# Patient Record
Sex: Female | Born: 2000 | Race: Black or African American | Hispanic: No | Marital: Single | Smoking: Never smoker
Health system: Southern US, Community
[De-identification: ages and names within clinical notes are randomized; demographics above are authoritative.]

## PROBLEM LIST (undated history)

## (undated) ENCOUNTER — Inpatient Hospital Stay (HOSPITAL_COMMUNITY): Payer: Self-pay

## (undated) ENCOUNTER — Ambulatory Visit: Source: Home / Self Care

## (undated) DIAGNOSIS — Z789 Other specified health status: Secondary | ICD-10-CM

## (undated) DIAGNOSIS — D649 Anemia, unspecified: Secondary | ICD-10-CM

## (undated) HISTORY — DX: Anemia, unspecified: D64.9

## (undated) HISTORY — PX: NO PAST SURGERIES: SHX2092

## (undated) HISTORY — DX: Other specified health status: Z78.9

---
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
## 2022-11-20 ENCOUNTER — Other Ambulatory Visit: Payer: Self-pay

## 2022-11-20 ENCOUNTER — Emergency Department (HOSPITAL_COMMUNITY)
Admission: EM | Admit: 2022-11-20 | Discharge: 2022-11-21 | Discharge status: Against Medical Advice | Payer: Self-pay | Attending: Emergency Medicine | Admitting: Emergency Medicine

## 2022-11-20 ENCOUNTER — Encounter (HOSPITAL_COMMUNITY): Payer: Self-pay | Admitting: Emergency Medicine

## 2022-11-20 DIAGNOSIS — S39012A Strain of muscle, fascia and tendon of lower back, initial encounter: Secondary | ICD-10-CM | POA: Insufficient documentation

## 2022-11-20 DIAGNOSIS — Z5321 Procedure and treatment not carried out due to patient leaving prior to being seen by health care provider: Secondary | ICD-10-CM | POA: Insufficient documentation

## 2022-11-20 DIAGNOSIS — X58XXXA Exposure to other specified factors, initial encounter: Secondary | ICD-10-CM | POA: Insufficient documentation

## 2022-11-20 NOTE — ED Triage Notes (Signed)
Patient reports muscle strain at lower back with mild pain onset  yesterday when she sneezed multiple times .

## 2022-11-21 NOTE — ED Notes (Signed)
NA for vitals  °

## 2023-02-09 ENCOUNTER — Ambulatory Visit
Admission: EM | Admit: 2023-02-09 | Discharge: 2023-02-09 | Disposition: A | Discharge status: Home / Self Care | Payer: BC Managed Care – PPO | Attending: Family Medicine | Admitting: Family Medicine

## 2023-02-09 ENCOUNTER — Encounter: Payer: Self-pay | Admitting: Emergency Medicine

## 2023-02-09 DIAGNOSIS — N898 Other specified noninflammatory disorders of vagina: Secondary | ICD-10-CM | POA: Insufficient documentation

## 2023-02-09 DIAGNOSIS — M546 Pain in thoracic spine: Secondary | ICD-10-CM | POA: Diagnosis present

## 2023-02-09 DIAGNOSIS — J309 Allergic rhinitis, unspecified: Secondary | ICD-10-CM | POA: Insufficient documentation

## 2023-02-09 LAB — POCT URINALYSIS DIP (MANUAL ENTRY)
Bilirubin, UA: NEGATIVE
Glucose, UA: NEGATIVE mg/dL
Ketones, POC UA: NEGATIVE mg/dL
Leukocytes, UA: NEGATIVE
Nitrite, UA: NEGATIVE
Protein Ur, POC: NEGATIVE mg/dL
Spec Grav, UA: 1.025 (ref 1.010–1.025)
Urobilinogen, UA: 0.2 U/dL
pH, UA: 7 (ref 5.0–8.0)

## 2023-02-09 LAB — POCT URINE PREGNANCY: Preg Test, Ur: NEGATIVE

## 2023-02-09 MED ORDER — TIZANIDINE HCL 4 MG PO TABS: 4.0000 mg | ORAL_TABLET | Freq: Four times a day (QID) | ORAL | 0 refills | Status: DC | PRN

## 2023-02-09 NOTE — Discharge Instructions (Addendum)
You were seen today for various issues.  Your back pain is likely muscular in nature.  I have sent out a muscle relaxer for this.  I recommend motrin for pain, as well as heat/ice.  For your allergies I recommend you take over the counter allergy medications such as claritin/zyrtec, and add flonase nasal spray as well.  Your urine was normal.   Your vaginal swab will be resulted tomorrow and you will be notified if there is anything that needs to be treated.

## 2023-02-09 NOTE — ED Triage Notes (Signed)
States she's been having central lower back pain and mild abdominal cramping since Friday with some watery vaginal discharge. Does report unprotected intercourse with boyfriend, no use of birth control. States her period started this morning. Denies any fever, chills, dysuria, heavy lifting, new injury to back, numbness, tingling, weakness

## 2023-02-09 NOTE — ED Provider Notes (Signed)
[facility]    CSN: [id number] Arrival date & time: 02/09/23  1023      History   Chief Complaint Chief Complaint  Patient presents with   Back Pain    HPI LATOSIA GENNARELLI is a 22 y.o. female.    Back Pain Associated symptoms: abdominal pain   Patient is here for back pain.  This started over the weekend.  This she may have strained it from sneezing.  She has had allergies/congestion for  while.  Does not take anything for this.  She is having some abdominal pain, but thinks due to her period starting today.  She is sexually active.  She did not thin watery d/c and wanted to be tested for std.  No known exposures.  She did take pain reliever for abd cramping.        History reviewed. No pertinent past medical history.  There are no problems to display for this patient.   History reviewed. No pertinent surgical history.  OB History   No obstetric history on file.      Home Medications    Prior to Admission medications   Not on File    Family History History reviewed. No pertinent family history.  Social History Social History   Tobacco Use   Smoking status: Never   Smokeless tobacco: Never  Substance Use Topics   Alcohol use: Never   Drug use: Never     Allergies   Patient has no known allergies.   Review of Systems Review of Systems  Constitutional: Negative.   HENT:  Positive for congestion.   Respiratory: Negative.    Cardiovascular: Negative.   Gastrointestinal:  Positive for abdominal pain.  Genitourinary:  Positive for vaginal discharge.  Musculoskeletal:  Positive for back pain.     Physical Exam Triage Vital Signs ED Triage Vitals  Encounter Vitals Group     BP 02/09/23 1045 (!) 130/96     Systolic BP Percentile --      Diastolic BP Percentile --      Pulse Rate 02/09/23 1045 74     Resp 02/09/23 1045 16     Temp 02/09/23 1045 98.5 F (36.9 C)     Temp Source 02/09/23 1045 Oral     SpO2 02/09/23 1045 97 %      Weight --      Height --      Head Circumference --      Peak Flow --      Pain Score 02/09/23 1049 8     Pain Loc --      Pain Education --      Exclude from Growth Chart --    No data found.  Updated Vital Signs BP (!) 130/96   Pulse 74   Temp 98.5 F (36.9 C) (Oral)   Resp 16   SpO2 97%   Visual Acuity Right Eye Distance:   Left Eye Distance:   Bilateral Distance:    Right Eye Near:   Left Eye Near:    Bilateral Near:     Physical Exam Constitutional:      Appearance: Normal appearance.  HENT:     Nose: Congestion present.     Mouth/Throat:     Mouth: Mucous membranes are moist.  Cardiovascular:     Rate and Rhythm: Normal rate and regular rhythm.  Pulmonary:     Effort: Pulmonary effort is normal.     Breath sounds: Normal breath sounds.  Musculoskeletal:  Cervical back: Normal range of motion and neck supple.     Comments: +TTP to the mid back, at the spine and paraspinals;  very mildly tender;  no limited rom  Neurological:     General: No focal deficit present.     Mental Status: She is alert.  Psychiatric:        Mood and Affect: Mood normal.      UC Treatments / Results  Labs (all labs ordered are listed, but only abnormal results are displayed) Labs Reviewed  POCT URINALYSIS DIP (MANUAL ENTRY) - Abnormal; Notable for the following components:      Result Value   Blood, UA moderate (*)    All other components within normal limits  POCT URINE PREGNANCY  CERVICOVAGINAL ANCILLARY ONLY   Upt negative  EKG   Radiology No results found.  Procedures Procedures (including critical care time)  Medications Ordered in UC Medications - No data to display  Initial Impression / Assessment and Plan / UC Course  I have reviewed the triage vital signs and the nursing notes.  Pertinent labs & imaging results that were available during my care of the patient were reviewed by me and considered in my medical decision making (see chart for  details).   Final Clinical Impressions(s) / UC Diagnoses   Final diagnoses:  Acute midline thoracic back pain  Allergic rhinitis, unspecified seasonality, unspecified trigger  Vaginal discharge     Discharge Instructions      You were seen today for various issues.  Your back pain is likely muscular in nature.  I have sent out a muscle relaxer for this.  I recommend motrin for pain, as well as heat/ice.  For your allergies I recommend you take over the counter allergy medications such as claritin/zyrtec, and add flonase nasal spray as well.  Your urine was normal.   Your vaginal swab will be resulted tomorrow and you will be notified if there is anything that needs to be treated.     ED Prescriptions     Medication Sig Dispense Auth. Provider   tiZANidine (ZANAFLEX) 4 MG tablet Take 1 tablet (4 mg total) by mouth every 6 (six) hours as needed for muscle spasms. 20 tablet Jannifer Franklin, MD      PDMP not reviewed this encounter.   Jannifer Franklin, MD 02/09/23 1112

## 2023-02-11 LAB — CERVICOVAGINAL ANCILLARY ONLY
Bacterial Vaginitis (gardnerella): POSITIVE — AB
Candida Glabrata: NEGATIVE
Candida Vaginitis: NEGATIVE
Chlamydia: NEGATIVE
Comment: NEGATIVE
Comment: NEGATIVE
Comment: NEGATIVE
Comment: NEGATIVE
Comment: NEGATIVE
Comment: NORMAL
Neisseria Gonorrhea: NEGATIVE
Trichomonas: NEGATIVE

## 2023-02-12 ENCOUNTER — Telehealth: Payer: Self-pay | Admitting: Emergency Medicine

## 2023-02-12 ENCOUNTER — Telehealth: Payer: Self-pay | Admitting: Family Medicine

## 2023-02-12 MED ORDER — METRONIDAZOLE 500 MG PO TABS: 500.0000 mg | ORAL_TABLET | Freq: Two times a day (BID) | ORAL | 0 refills | Status: DC

## 2023-02-12 NOTE — Telephone Encounter (Signed)
Patient informed. 

## 2023-02-12 NOTE — Telephone Encounter (Signed)
Patient calling regarding her positive BV results on her Aptima swab from 02/09/2023.  Pharmacy on file is correct.  Please send prescription in.

## 2023-02-12 NOTE — Telephone Encounter (Signed)
+   BV.  Sent to pharmacy on file: Meds ordered this encounter  Medications   metroNIDAZOLE (FLAGYL) 500 MG tablet    Sig: Take 1 tablet (500 mg total) by mouth 2 (two) times daily.    Dispense:  14 tablet    Refill:  0

## 2023-04-22 NOTE — L&D Delivery Note (Signed)
 [facility] Delivery Note  Tracy Nichols is a 23 y.o. G1P0000 s/p SVD at 106w2d. She was admitted for IOL due to EFW at 16% with a BPP of 6/8.   ROM: 9h 89m with clear fluid GBS Status: Negative/-- (07/24 1555)     Labor Progress: Initial SVE: 1/thick/posterior. She then progressed to complete.   Delivery Date/Time: 11/28/2023 @ [phone] Delivery: Called to room and patient was complete and pushing. Head delivered ROA. Nuchal cord present x1 that was easily reduced.  Shoulder and body delivered in usual fashion. Infant with spontaneous cry, placed on mother's abdomen, dried and stimulated. Cord clamped x 2 after 1-minute delay, and cut by FOB. Cord blood drawn. Placenta delivered spontaneously with gentle cord traction. Fundus firm with massage and Pitocin . Labia, perineum, vagina, and cervix inspected inspected with 2nd degree perineal laceration found that was repaired with 3-0 vicryl. Left labial first degree also identified that was also repaired with 3-0 vicryl.   Baby Weight: pending  Placenta: Sent to L&D Complications: None Lacerations: 2nd degree perineal  1st degree left labial  EBL: 20 mL Analgesia: Epidural   Infant:  APGAR (1 MIN): 9  APGAR (5 MINS): 9

## 2023-06-24 ENCOUNTER — Ambulatory Visit: Payer: BC Managed Care – PPO | Admitting: *Deleted

## 2023-06-24 DIAGNOSIS — Z3A15 15 weeks gestation of pregnancy: Secondary | ICD-10-CM

## 2023-06-24 DIAGNOSIS — Z3402 Encounter for supervision of normal first pregnancy, second trimester: Secondary | ICD-10-CM

## 2023-06-24 DIAGNOSIS — Z3480 Encounter for supervision of other normal pregnancy, unspecified trimester: Secondary | ICD-10-CM | POA: Insufficient documentation

## 2023-06-24 NOTE — Progress Notes (Signed)
 New OB Intake  I connected with Tracy Nichols  on 06/24/23 at  2:10 PM EST by In Person Visit and verified that I am speaking with the correct person using two identifiers. Nurse is located at [facility] and pt is located at home.  I discussed the limitations, risks, security and privacy concerns of performing an evaluation and management service by telephone and the availability of in person appointments. I also discussed with the patient that there may be a patient responsible charge related to this service. The patient expressed understanding and agreed to proceed.  I explained I am completing New OB Intake today. We discussed EDD of 12/10/2023, by Last Menstrual Period. Pt is G1P0000. I reviewed her allergies, medications and Medical/Surgical/OB history.    There are no active problems to display for this patient.   Concerns addressed today  Delivery Plans Plans to deliver at [facility]. Discussed the nature of our practice with multiple providers including residents and students. Due to the size of the practice, the delivering provider may not be the same as those providing prenatal care.   Patient is not interested in water birth. Offered upcoming OB visit with CNM to discuss further.  MyChart/Babyscripts MyChart access verified. I explained pt will have some visits in office and some virtually. Babyscripts instructions given and order placed. Patient verifies receipt of registration text/e-mail. Account successfully created and app downloaded. If patient is a candidate for Optimized scheduling, add to sticky note.   Blood Pressure Cuff/Weight Scale Blood pressure cuff ordered for patient to pick-up from [employer]. Explained after first prenatal appt pt will check weekly and document in Babyscripts. Patient does not have weight scale; patient may purchase if they desire to track weight weekly in Babyscripts.  Anatomy US Explained first scheduled [place] will be around 19 weeks. Anatomy US  scheduled for TBD at TBD.  Interested in [place]? If yes, send referral and doula dot phrase.   Is patient a candidate for Babyscripts Optimization? Yes, patient accepted    First visit review I reviewed new OB appt with patient. Explained pt will be seen by Dr. Debroah Nichols at first visit. Discussed [name] genetic screening with patient. Requests Panorama and Horizon.. Routine prenatal labs  not collected. Virtual visit.    Last Pap No results found for: "DIAGPAP"  Harrel Lemon, RN 06/24/2023  2:42 PM

## 2023-06-24 NOTE — Patient Instructions (Signed)

## 2023-06-26 ENCOUNTER — Ambulatory Visit
Admission: EM | Admit: 2023-06-26 | Discharge: 2023-06-26 | Disposition: A | Discharge status: Home / Self Care | Attending: Internal Medicine | Admitting: Internal Medicine

## 2023-06-26 DIAGNOSIS — R21 Rash and other nonspecific skin eruption: Secondary | ICD-10-CM

## 2023-06-26 MED ORDER — HYDROCORTISONE 0.5 % EX CREA: 1.0000 | TOPICAL_CREAM | Freq: Two times a day (BID) | CUTANEOUS | 0 refills | Status: DC

## 2023-06-26 NOTE — Discharge Instructions (Signed)
 I have prescribed you topical cream to apply to face.  Do not get this in your eyes.  Follow-up with dermatology as I have placed a referral.

## 2023-06-26 NOTE — ED Triage Notes (Signed)
 Pt presents with rash that she first noticed in Nov. Pt initially thought it was ringworm. She has since learned it was not. The rash has spread since she first noticed. Rash areas are not painful, just dry and itchy. Now she is noticing the rash is spreading into her hairline. She has used Ringworm cream and it has helped a little but didn't remove the rash.

## 2023-06-26 NOTE — ED Provider Notes (Signed)
 [facility]    CSN: [id number] Arrival date & time: 06/26/23  1737      History   Chief Complaint Chief Complaint  Patient presents with   Rash    HPI Tracy Nichols is a 23 y.o. female.   Patient presents with itchy rash to face and neck that she noticed in November 2024.  Reports that she was seen once in urgent care.  Patient states she was not prescribed medication and she was not told what the rash was.  She originally thought it was ringworm so she has been applying topical antifungal cream which has not provided any improvement.  Denies any obvious known sick contacts.  Also reports she is currently [redacted] weeks pregnant.  Denies any obvious changes to the environment.  This patient states she is concerned it could be related to pregnancy as it started directly after she became pregnant.   Rash   Past Medical History:  Diagnosis Date   Medical history non-contributory     Patient Active Problem List   Diagnosis Date Noted   Supervision of other normal pregnancy, antepartum 06/24/2023    Past Surgical History:  Procedure Laterality Date   NO PAST SURGERIES      OB History     Gravida  1   Para  0   Term  0   Preterm  0   AB  0   Living  0      SAB  0   IAB  0   Ectopic  0   Multiple  0   Live Births  0            Home Medications    Prior to Admission medications   Medication Sig Start Date End Date Taking? Authorizing Provider  hydrocortisone cream 0.5 % Apply 1 Application topically 2 (two) times daily. Apply thin film 06/26/23  Yes Talita Recht, [place] E, FNP  metroNIDAZOLE (FLAGYL) 500 MG tablet Take 1 tablet (500 mg total) by mouth 2 (two) times daily. 02/12/23   Mardella Layman, MD  Prenatal Vit-Fe Fumarate-FA (PRENATAL VITAMIN PO) Take 1 tablet by mouth daily.    [provider]  tiZANidine (ZANAFLEX) 4 MG tablet Take 1 tablet (4 mg total) by mouth every 6 (six) hours as needed for muscle spasms. Patient not taking:  Reported on 06/24/2023 02/09/23   Jannifer Franklin, MD    Family History No family history on file.  Social History Social History   Tobacco Use   Smoking status: Never   Smokeless tobacco: Never  Vaping Use   Vaping status: Never Used  Substance Use Topics   Alcohol use: Never   Drug use: Not Currently    Types: Marijuana     Allergies   Patient has no known allergies.   Review of Systems Review of Systems Per HPI  Physical Exam Triage Vital Signs ED Triage Vitals  Encounter Vitals Group     BP 06/26/23 1816 137/84     Systolic BP Percentile --      Diastolic BP Percentile --      Pulse Rate 06/26/23 1816 96     Resp 06/26/23 1816 16     Temp 06/26/23 1816 97.7 F (36.5 C)     Temp Source 06/26/23 1816 Oral     SpO2 06/26/23 1816 98 %     Weight --      Height --      Head Circumference --  Peak Flow --      Pain Score 06/26/23 1815 0     Pain Loc --      Pain Education --      Exclude from Growth Chart --    No data found.  Updated Vital Signs BP 137/84 (BP Location: Right Arm)   Pulse 96   Temp 97.7 F (36.5 C) (Oral)   Resp 16   LMP 03/05/2023   SpO2 98%   Visual Acuity Right Eye Distance:   Left Eye Distance:   Bilateral Distance:    Right Eye Near:   Left Eye Near:    Bilateral Near:     Physical Exam Constitutional:      General: She is not in acute distress.    Appearance: Normal appearance. She is not toxic-appearing or diaphoretic.  HENT:     Head: Normocephalic and atraumatic.  Eyes:     Extraocular Movements: Extraocular movements intact.     Conjunctiva/sclera: Conjunctivae normal.  Pulmonary:     Effort: Pulmonary effort is normal.  Skin:    Comments: Patient has various circular shaped areas to face and 1 area to anterior neck that is flat, and mildly scaly.   Neurological:     General: No focal deficit present.     Mental Status: She is alert and oriented to person, place, and time. Mental status is at baseline.   Psychiatric:        Mood and Affect: Mood normal.        Behavior: Behavior normal.        Thought Content: Thought content normal.        Judgment: Judgment normal.      UC Treatments / Results  Labs (all labs ordered are listed, but only abnormal results are displayed) Labs Reviewed - No data to display  EKG   Radiology No results found.  Procedures Procedures (including critical care time)  Medications Ordered in UC Medications - No data to display  Initial Impression / Assessment and Plan / UC Course  I have reviewed the triage vital signs and the nursing notes.  Pertinent labs & imaging results that were available during my care of the patient were reviewed by me and considered in my medical decision making (see chart for details).     Rash is consistent with dermatitis.  No signs of fungal or bacterial infection.  Will treat with topical hydrocortisone at a very low dose.  I do think benefits outweigh risks while pregnant as it is also only topical.  Advised patient to not get this medication in her eyes and to apply a thin film.  Patient also advised to see dermatologist given how long rash has been present so ambulatory referral to dermatology was placed.  Advised strict return precautions.  Patient verbalized understanding and was agreeable with plan. Final Clinical Impressions(s) / UC Diagnoses   Final diagnoses:  Rash and nonspecific skin eruption     Discharge Instructions      I have prescribed you topical cream to apply to face.  Do not get this in your eyes.  Follow-up with dermatology as I have placed a referral.    ED Prescriptions     Medication Sig Dispense Auth. Provider   hydrocortisone cream 0.5 % Apply 1 Application topically 2 (two) times daily. Apply thin film 30 g Gustavus Bryant, [place]      PDMP not reviewed this encounter.   Gustavus Bryant, [place] 06/26/23 1850

## 2023-07-02 ENCOUNTER — Ambulatory Visit (INDEPENDENT_AMBULATORY_CARE_PROVIDER_SITE_OTHER): Payer: BC Managed Care – PPO | Admitting: Obstetrics & Gynecology

## 2023-07-02 VITALS — BP 116/79 | HR 93 | Ht 61.0 in | Wt 116.0 lb

## 2023-07-02 DIAGNOSIS — Z3480 Encounter for supervision of other normal pregnancy, unspecified trimester: Secondary | ICD-10-CM

## 2023-07-02 DIAGNOSIS — Z3402 Encounter for supervision of normal first pregnancy, second trimester: Secondary | ICD-10-CM | POA: Diagnosis not present

## 2023-07-02 DIAGNOSIS — Z3A17 17 weeks gestation of pregnancy: Secondary | ICD-10-CM

## 2023-07-02 NOTE — Progress Notes (Signed)
  Subjective:transferred from [employer] Tracy Nichols is a G1P0000 [redacted]w[redacted]d being seen today for her first obstetrical visit.  Her obstetrical history is significant for  healthy patient . Patient does intend to breast feed. Pregnancy history fully reviewed.  Patient reports nausea.  Vitals:   07/02/23 0825 07/02/23 0826  BP: 116/79   Pulse: 93   Weight: 116 lb (52.6 kg)   Height:  5\' 1"  (1.549 m)    HISTORY: OB History  Gravida Para Term Preterm AB Living  1 0 0 0 0 0  SAB IAB Ectopic Multiple Live Births  0 0 0 0 0    # Outcome Date GA Lbr Len/2nd Weight Sex Type Anes PTL Lv  1 Current            Past Medical History:  Diagnosis Date   Medical history non-contributory    Past Surgical History:  Procedure Laterality Date   NO PAST SURGERIES     No family history on file.   Exam    Uterus:   17 week  Pelvic Exam: Asked to defer   Perineum:    Vulva:    Vagina:     pH:    Cervix:    Adnexa:    Bony Pelvis:   System: Breast:     Skin: normal coloration and turgor, no rashes    Neurologic: oriented, normal mood   Extremities: normal strength, tone, and muscle mass   HEENT PERRLA, sclera clear, anicteric, and neck supple with midline trachea   Mouth/Teeth mucous membranes moist, pharynx normal without lesions and dental hygiene good   Neck supple   Cardiovascular: regular rate and rhythm, no murmurs or gallops   Respiratory:  appears well, vitals normal, no respiratory distress, acyanotic, normal RR, chest clear, no wheezing, crepitations, rhonchi, normal symmetric air entry   Abdomen: soft, non-tender; bowel sounds normal; no masses,  no organomegaly   Urinary:       Assessment:    Pregnancy: G1P0000 Patient Active Problem List   Diagnosis Date Noted   Supervision of other normal pregnancy, antepartum 06/24/2023        Plan:     Initial labs drawn. Prenatal vitamins. Problem list reviewed and updated. Genetic Screening discussed : ordered.   Ultrasound discussed; fetal survey: ordered.  Follow up in 4 weeks. 50% of 30 min visit spent on counseling and coordination of care.  Needs pap next visit   Scheryl Darter 07/02/2023

## 2023-07-03 LAB — CBC/D/PLT+RPR+RH+ABO+RUBIGG...
Antibody Screen: NEGATIVE
Basophils Absolute: 0.1 10*3/uL (ref 0.0–0.2)
Basos: 1 %
EOS (ABSOLUTE): 1.6 10*3/uL — ABNORMAL HIGH (ref 0.0–0.4)
Eos: 21 %
HCV Ab: NONREACTIVE
HIV Screen 4th Generation wRfx: NONREACTIVE
Hematocrit: 36.1 % (ref 34.0–46.6)
Hemoglobin: 11.9 g/dL (ref 11.1–15.9)
Hepatitis B Surface Ag: NEGATIVE
Immature Grans (Abs): 0 10*3/uL (ref 0.0–0.1)
Immature Granulocytes: 0 %
Lymphocytes Absolute: 2 10*3/uL (ref 0.7–3.1)
Lymphs: 26 %
MCH: 30.2 pg (ref 26.6–33.0)
MCHC: 33 g/dL (ref 31.5–35.7)
MCV: 92 fL (ref 79–97)
Monocytes Absolute: 0.6 10*3/uL (ref 0.1–0.9)
Monocytes: 7 %
Neutrophils Absolute: 3.5 10*3/uL (ref 1.4–7.0)
Neutrophils: 45 %
Platelets: 202 10*3/uL (ref 150–450)
RBC: 3.94 x10E6/uL (ref 3.77–5.28)
RDW: 12.7 % (ref 11.7–15.4)
RPR Ser Ql: NONREACTIVE
Rh Factor: POSITIVE
Rubella Antibodies, IGG: 1.07 {index} (ref >0.99)
WBC: 7.8 10*3/uL (ref 3.4–10.8)

## 2023-07-03 LAB — HCV INTERPRETATION

## 2023-07-04 LAB — CULTURE, OB URINE

## 2023-07-04 LAB — URINE CULTURE, OB REFLEX

## 2023-07-07 LAB — AFP, SERUM, OPEN SPINA BIFIDA
AFP MoM: 1.02
AFP Value: 50.9 ng/mL
Gest. Age on Collection Date: 17 wk
Maternal Age At EDD: 21.8 a
OSBR Risk 1 IN: 10000
Test Results:: NEGATIVE
Weight: 116 [lb_av]

## 2023-07-09 LAB — PANORAMA PRENATAL TEST FULL PANEL:PANORAMA TEST PLUS 5 ADDITIONAL MICRODELETIONS: FETAL FRACTION: 9

## 2023-07-13 LAB — HORIZON CUSTOM: REPORT SUMMARY: NEGATIVE

## 2023-07-14 ENCOUNTER — Encounter: Payer: Self-pay | Admitting: Obstetrics & Gynecology

## 2023-07-21 ENCOUNTER — Other Ambulatory Visit: Payer: Self-pay

## 2023-07-21 ENCOUNTER — Ambulatory Visit: Attending: Obstetrics and Gynecology

## 2023-07-21 ENCOUNTER — Ambulatory Visit

## 2023-07-21 VITALS — BP 115/77 | HR 91

## 2023-07-21 DIAGNOSIS — Z363 Encounter for antenatal screening for malformations: Secondary | ICD-10-CM | POA: Diagnosis present

## 2023-07-21 DIAGNOSIS — Z3480 Encounter for supervision of other normal pregnancy, unspecified trimester: Secondary | ICD-10-CM

## 2023-07-21 DIAGNOSIS — O365990 Maternal care for other known or suspected poor fetal growth, unspecified trimester, not applicable or unspecified: Secondary | ICD-10-CM | POA: Diagnosis not present

## 2023-07-21 DIAGNOSIS — Z3689 Encounter for other specified antenatal screening: Secondary | ICD-10-CM | POA: Insufficient documentation

## 2023-07-21 DIAGNOSIS — O365920 Maternal care for other known or suspected poor fetal growth, second trimester, not applicable or unspecified: Secondary | ICD-10-CM

## 2023-07-21 DIAGNOSIS — Z3A19 19 weeks gestation of pregnancy: Secondary | ICD-10-CM | POA: Diagnosis not present

## 2023-07-30 ENCOUNTER — Ambulatory Visit (INDEPENDENT_AMBULATORY_CARE_PROVIDER_SITE_OTHER): Admitting: Obstetrics and Gynecology

## 2023-07-30 ENCOUNTER — Encounter: Payer: Self-pay | Admitting: Obstetrics and Gynecology

## 2023-07-30 VITALS — BP 99/76 | HR 94 | Wt 124.0 lb

## 2023-07-30 DIAGNOSIS — O26892 Other specified pregnancy related conditions, second trimester: Secondary | ICD-10-CM

## 2023-07-30 DIAGNOSIS — Z3480 Encounter for supervision of other normal pregnancy, unspecified trimester: Secondary | ICD-10-CM

## 2023-07-30 DIAGNOSIS — Z5320 Procedure and treatment not carried out because of patient's decision for unspecified reasons: Secondary | ICD-10-CM | POA: Diagnosis not present

## 2023-07-30 DIAGNOSIS — Z3A21 21 weeks gestation of pregnancy: Secondary | ICD-10-CM | POA: Diagnosis not present

## 2023-07-30 DIAGNOSIS — R519 Headache, unspecified: Secondary | ICD-10-CM

## 2023-07-30 NOTE — Progress Notes (Addendum)
 Pt complains of HA's, has not tried OTC medication. Pt also craving Ice.   Pt request to have pap at [facility] visit.

## 2023-07-30 NOTE — Progress Notes (Signed)
   PRENATAL VISIT NOTE  Subjective:  Tracy Nichols is a 23 y.o. G1P0000 at [redacted]w[redacted]d being seen today for ongoing prenatal care.  She is currently monitored for the following issues for this low-risk pregnancy and has Supervision of other normal pregnancy, antepartum on their problem list.  Patient reports headache. Has not tried OTC meds. Reports typically goes away when she drinks water. Had caffeine this morning, no improvement. Denies visual changes or severe pain.  Contractions: Not present. Vag. Bleeding: None.  Movement: Present. Denies leaking of fluid.   The following portions of the patient's history were reviewed and updated as appropriate: allergies, current medications, past family history, past medical history, past social history, past surgical history and problem list.   Objective:   Vitals:   07/30/23 0901  BP: 99/76  Pulse: 94  Weight: 124 lb (56.2 kg)   Body mass index is 23.43 kg/m. Total weight gain: 19 lb (8.618 kg)   Fetal Status: Fetal Heart Rate (bpm): 140 Fundal Height: 21 cm Movement: Present     General:  Alert, oriented and cooperative. Patient is in no acute distress.  Skin: Skin is warm and dry. No rash noted.   Cardiovascular: Normal heart rate noted  Respiratory: Normal respiratory effort, no problems with respiration noted  Abdomen: Soft, gravid, appropriate for gestational age.  Pain/Pressure: Absent     Pelvic: Cervical exam deferred        Extremities: Normal range of motion.     Mental Status: Normal mood and affect. Normal behavior. Normal judgment and thought content.   Assessment and Plan:  Pregnancy: G1P0000 at [redacted]w[redacted]d 1. Supervision of other normal pregnancy, antepartum (Primary) Routine care S/p anatomy ultrasound on 07/21/23 Has f/u ultrasound scheduled for 5/13 Prenatal labs and genetics within normal limits   2. [redacted] weeks gestation of pregnancy See above  3. Pap smear of cervix declined Recommend pap, she declines to postpartum  4.  Headache in pregnancy: recommend trial of tylenol, if not improved or frequent headaches recommend magnesium supplementation   Preterm labor symptoms and general obstetric precautions including but not limited to vaginal bleeding, contractions, leaking of fluid and fetal movement were reviewed in detail with the patient. Please refer to After Visit Summary for other counseling recommendations.   Return in about 4 weeks (around 08/27/2023).  Future Appointments  Date Time Provider Department Center  09/01/2023  3:00 PM [facility] PROVIDER 1 [facility]  09/01/2023  3:30 PM [facility] US2 [facility]    Wanita Chamberlain, MD

## 2023-08-13 ENCOUNTER — Ambulatory Visit
Admission: EM | Admit: 2023-08-13 | Discharge: 2023-08-13 | Disposition: A | Discharge status: Home / Self Care | Attending: Family Medicine | Admitting: Family Medicine

## 2023-08-13 ENCOUNTER — Encounter: Payer: Self-pay | Admitting: Emergency Medicine

## 2023-08-13 DIAGNOSIS — N760 Acute vaginitis: Secondary | ICD-10-CM | POA: Diagnosis present

## 2023-08-13 DIAGNOSIS — Z3A23 23 weeks gestation of pregnancy: Secondary | ICD-10-CM | POA: Insufficient documentation

## 2023-08-13 MED ORDER — CLOTRIMAZOLE 1 % VA CREA: 1.0000 | TOPICAL_CREAM | Freq: Every day | VAGINAL | 0 refills | Status: AC

## 2023-08-13 MED ORDER — CLOTRIMAZOLE 1 % VA CREA: 1.0000 | TOPICAL_CREAM | Freq: Every day | VAGINAL | 0 refills | Status: DC

## 2023-08-13 NOTE — Discharge Instructions (Signed)
 Lab results take up to 2-4 days, however can result sooner.   Our office will only call if results are abnormal and require treatment. If you see a result and have questions feel free to reach out via phone and call this office directly and request to speak with the nurse.  All results will be available to view on MyChart.

## 2023-08-13 NOTE — ED Triage Notes (Signed)
 Pt reports vaginal itching and white discharge x1week. Believes she has a yeast infection and noted last time she was treated for yeast she developed BV. Pt is currently 6 months pregnant.

## 2023-08-13 NOTE — ED Provider Notes (Signed)
 [facility]    CSN: [id number] Arrival date & time: 08/13/23  1545      History   Chief Complaint Chief Complaint  Patient presents with   Vaginal Discharge   Vaginal Itching    HPI Tracy Nichols is a 23 y.o. female.   Here with abnormal vaginal discharge x 1 week.  Patient is concerned she may have a yeast infection.  Patient tested positive for BV several months ago thinks she may have some residual yeast related to that treatment back in October.  Patient is currently [redacted] weeks pregnant and is followed by OB and recently had a visit a week ago.  Past Medical History:  Diagnosis Date   Medical history non-contributory     Patient Active Problem List   Diagnosis Date Noted   Supervision of other normal pregnancy, antepartum 06/24/2023    Past Surgical History:  Procedure Laterality Date   NO PAST SURGERIES      OB History     Gravida  1   Para  0   Term  0   Preterm  0   AB  0   Living  0      SAB  0   IAB  0   Ectopic  0   Multiple  0   Live Births  0            Home Medications    Prior to Admission medications   Medication Sig Start Date End Date Taking? Authorizing Provider  hydrocortisone  cream 0.5 % Apply 1 Application topically 2 (two) times daily. Apply thin film 06/26/23  [place], [place] E, FNP  clotrimazole  (GYNE-LOTRIMIN ) 1 % vaginal cream Place 1 Applicatorful vaginally at bedtime for 5 days. 08/13/23 08/18/23  Buena Carmine, NP  Prenatal Vit-Fe Fumarate-FA (PRENATAL VITAMIN PO) Take 1 tablet by mouth daily. Patient not taking: Reported on 07/21/2023    [provider]  tiZANidine  (ZANAFLEX ) 4 MG tablet Take 1 tablet (4 mg total) by mouth every 6 (six) hours as needed for muscle spasms. Patient not taking: Reported on 06/24/2023 02/09/23   Lesle Ras, MD    Family History History reviewed. No pertinent family history.  Social History Social History   Tobacco Use   Smoking status: Never    Smokeless tobacco: Never  Vaping Use   Vaping status: Never Used  Substance Use Topics   Alcohol use: Not Currently   Drug use: Not Currently    Types: Marijuana     Allergies   Patient has no known allergies.   Review of Systems Review of Systems  Genitourinary:  Positive for vaginal discharge.     Physical Exam Triage Vital Signs ED Triage Vitals [08/13/23 1652]  Encounter Vitals Group     BP 121/83     Systolic BP Percentile      Diastolic BP Percentile      Pulse Rate 67     Resp 14     Temp 97.8 F (36.6 C)     Temp Source Oral     SpO2 99 %     Weight      Height      Head Circumference      Peak Flow      Pain Score 0     Pain Loc      Pain Education      Exclude from Growth Chart    No data found.  Updated Vital Signs BP 121/83 (BP  Location: Left Arm)   Pulse 67   Temp 97.8 F (36.6 C) (Oral)   Resp 14   LMP 03/05/2023   SpO2 99%   Visual Acuity Right Eye Distance:   Left Eye Distance:   Bilateral Distance:    Right Eye Near:   Left Eye Near:    Bilateral Near:     Physical Exam Vitals reviewed.  Constitutional:      Appearance: Normal appearance.  HENT:     Head: Normocephalic and atraumatic.  Eyes:     Extraocular Movements: Extraocular movements intact.     Pupils: Pupils are equal, round, and reactive to light.  Cardiovascular:     Rate and Rhythm: Normal rate and regular rhythm.  Pulmonary:     Effort: Pulmonary effort is normal.     Breath sounds: Normal breath sounds.  Skin:    General: Skin is warm.  Neurological:     General: No focal deficit present.     Mental Status: She is alert and oriented to person, place, and time.     Cytology self collected UC Treatments / Results  Labs (all labs ordered are listed, but only abnormal results are displayed) Labs Reviewed  CERVICOVAGINAL ANCILLARY ONLY    EKG   Radiology No results found.  Procedures Procedures (including critical care time)  Medications  Ordered in UC Medications - No data to display  Initial Impression / Assessment and Plan / UC Course  I have reviewed the triage vital signs and the nursing notes.  Pertinent labs & imaging results that were available during my care of the patient were reviewed by me and considered in my medical decision making (see chart for details).    Vaginitis in patient that is [redacted] weeks pregnant, treatment with Clotrimazole . Cytology testing for BV, candidiasis, GC chlamydia, gonorrhea, and trichomonas. Patient aware that our office will reach out to her if any additional treatment is needed after review of lab results. Final Clinical Impressions(s) / UC Diagnoses   Final diagnoses:  Vaginitis and vulvovaginitis  [redacted] weeks gestation of pregnancy     Discharge Instructions      Lab results take up to 2-4 days, however can result sooner.   Our office will only call if results are abnormal and require treatment. If you see a result and have questions feel free to reach out via phone and call this office directly and request to speak with the nurse.  All results will be available to view on MyChart.      ED Prescriptions     Medication Sig Dispense Auth. Provider   clotrimazole  (GYNE-LOTRIMIN ) 1 % vaginal cream  (Status: Discontinued) Place 1 Applicatorful vaginally at bedtime. 45 g Buena Carmine, NP   clotrimazole  (GYNE-LOTRIMIN ) 1 % vaginal cream Place 1 Applicatorful vaginally at bedtime for 5 days. 90 g Buena Carmine, NP      PDMP not reviewed this encounter.   Buena Carmine, NP 08/14/23 [phone]

## 2023-08-14 LAB — CERVICOVAGINAL ANCILLARY ONLY
Bacterial Vaginitis (gardnerella): NEGATIVE
Candida Glabrata: NEGATIVE
Candida Vaginitis: NEGATIVE
Chlamydia: NEGATIVE
Comment: NEGATIVE
Comment: NEGATIVE
Comment: NEGATIVE
Comment: NEGATIVE
Comment: NEGATIVE
Comment: NORMAL
Neisseria Gonorrhea: NEGATIVE
Trichomonas: NEGATIVE

## 2023-08-27 ENCOUNTER — Ambulatory Visit: Admitting: Obstetrics & Gynecology

## 2023-08-27 ENCOUNTER — Encounter: Admitting: Obstetrics & Gynecology

## 2023-08-27 VITALS — BP 123/85 | HR 91 | Wt 131.4 lb

## 2023-08-27 DIAGNOSIS — Z3480 Encounter for supervision of other normal pregnancy, unspecified trimester: Secondary | ICD-10-CM | POA: Diagnosis not present

## 2023-08-27 NOTE — Progress Notes (Signed)
 Pt presents for rob pt has no questions or concerns at this time.

## 2023-08-27 NOTE — Progress Notes (Signed)
   PRENATAL VISIT NOTE  Subjective:  Tracy Nichols is a 23 y.o. G1P0000 at [redacted]w[redacted]d being seen today for ongoing prenatal care.  She is currently monitored for the following issues for this low-risk pregnancy and has Supervision of other normal pregnancy, antepartum on their problem list.  Patient reports round ligament pain.  Contractions: Not present. Vag. Bleeding: None.  Movement: Present. Denies leaking of fluid.   The following portions of the patient's history were reviewed and updated as appropriate: allergies, current medications, past family history, past medical history, past social history, past surgical history and problem list.   Objective:   Vitals:   08/27/23 1548  BP: 123/85  Pulse: 91  Weight: 131 lb 6.4 oz (59.6 kg)     Fetal Status: Fetal Heart Rate (bpm): 155   Movement: Present      General: Alert, oriented and cooperative. Patient is in no acute distress.  Skin: Skin is warm and dry. No rash noted.   Cardiovascular: Normal heart rate noted  Respiratory: Normal respiratory effort, no problems with respiration noted  Abdomen: Soft, gravid, appropriate for gestational age.  Pain/Pressure: Present     Pelvic: Cervical exam deferred        Extremities: Normal range of motion.  Edema: None  Mental Status: Normal mood and affect. Normal behavior. Normal judgment and thought content.   Assessment and Plan:  Pregnancy: G1P0000 at [redacted]w[redacted]d 1. Supervision of other normal pregnancy, antepartum (Primary) 2 hr GTT next  Preterm labor symptoms and general obstetric precautions including but not limited to vaginal bleeding, contractions, leaking of fluid and fetal movement were reviewed in detail with the patient. Please refer to After Visit Summary for other counseling recommendations.   Return in about 3 weeks (around 09/17/2023).  No future appointments.  Onnie Bilis, MD

## 2023-09-01 ENCOUNTER — Ambulatory Visit

## 2023-09-01 ENCOUNTER — Telehealth: Payer: Self-pay

## 2023-09-21 ENCOUNTER — Other Ambulatory Visit (HOSPITAL_COMMUNITY)
Admission: RE | Admit: 2023-09-21 | Discharge: 2023-09-21 | Disposition: A | Discharge status: Home / Self Care | Source: Ambulatory Visit | Attending: Obstetrics and Gynecology | Admitting: Obstetrics and Gynecology

## 2023-09-21 ENCOUNTER — Other Ambulatory Visit

## 2023-09-21 ENCOUNTER — Ambulatory Visit: Payer: Self-pay | Admitting: Obstetrics and Gynecology

## 2023-09-21 ENCOUNTER — Encounter: Payer: Self-pay | Admitting: Obstetrics and Gynecology

## 2023-09-21 VITALS — BP 107/73 | HR 97 | Wt 133.0 lb

## 2023-09-21 DIAGNOSIS — Z3A28 28 weeks gestation of pregnancy: Secondary | ICD-10-CM

## 2023-09-21 DIAGNOSIS — O26899 Other specified pregnancy related conditions, unspecified trimester: Secondary | ICD-10-CM | POA: Insufficient documentation

## 2023-09-21 DIAGNOSIS — Z1331 Encounter for screening for depression: Secondary | ICD-10-CM

## 2023-09-21 DIAGNOSIS — Z3480 Encounter for supervision of other normal pregnancy, unspecified trimester: Secondary | ICD-10-CM | POA: Diagnosis present

## 2023-09-21 DIAGNOSIS — N898 Other specified noninflammatory disorders of vagina: Secondary | ICD-10-CM | POA: Diagnosis not present

## 2023-09-21 DIAGNOSIS — O26893 Other specified pregnancy related conditions, third trimester: Secondary | ICD-10-CM | POA: Diagnosis not present

## 2023-09-21 NOTE — Progress Notes (Addendum)
 ROB/GTT. Declined TDAp. C/o vaginal discharge, irritation, self swab done.

## 2023-09-21 NOTE — Progress Notes (Signed)
   PRENATAL VISIT NOTE  Subjective:  Tracy Nichols is a 23 y.o. G1P0000 at [redacted]w[redacted]d being seen today for ongoing prenatal care.  She is currently monitored for the following issues for this low-risk pregnancy and has Supervision of other normal pregnancy, antepartum on their problem list.  Patient reports vaginal irritation.  Contractions: Not present. Vag. Bleeding: None.  Movement: Present. Denies leaking of fluid.   The following portions of the patient's history were reviewed and updated as appropriate: allergies, current medications, past family history, past medical history, past social history, past surgical history and problem list.   Objective:    Vitals:   09/21/23 1001  BP: 107/73  Pulse: 97  Weight: 133 lb (60.3 kg)    Fetal Status:  Fetal Heart Rate (bpm): 151 Fundal Height: 28 cm Movement: Present    General: Alert, oriented and cooperative. Patient is in no acute distress.  Skin: Skin is warm and dry. No rash noted.   Cardiovascular: Normal heart rate noted  Respiratory: Normal respiratory effort, no problems with respiration noted  Abdomen: Soft, gravid, appropriate for gestational age.  Pain/Pressure: Present     Pelvic: Cervical exam deferred        Extremities: Normal range of motion.  Edema: None  Mental Status: Normal mood and affect. Normal behavior. Normal judgment and thought content.   Assessment and Plan:  Pregnancy: G1P0000 at [redacted]w[redacted]d 1. Supervision of other normal pregnancy, antepartum (Primary) Patient is doing well without complaints Undecided on pediatrician Third trimester labs and glucola today - Glucose Tolerance, 2 Hours w/1 Hour - RPR - CBC - HIV antibody (with reflex) - Ambulatory referral to Integrated Behavioral Health - Cervicovaginal ancillary only( [place])  2. [redacted] weeks gestation of pregnancy  - Glucose Tolerance, 2 Hours w/1 Hour - RPR - CBC - HIV antibody (with reflex) - Ambulatory referral to Integrated Behavioral Health -  Cervicovaginal ancillary only( [place])  3. Vaginal discharge during pregnancy, antepartum Self swab collected for vaginal irritation - Cervicovaginal ancillary only( [place])  Preterm labor symptoms and general obstetric precautions including but not limited to vaginal bleeding, contractions, leaking of fluid and fetal movement were reviewed in detail with the patient. Please refer to After Visit Summary for other counseling recommendations.   Return in about 2 weeks (around 10/05/2023) for in person, ROB, Low risk.  Future Appointments  Date Time Provider Department Center  09/28/2023  7:00 AM [facility] PROVIDER 1 [facility]  09/28/2023  7:30 AM [facility] US3 [facility]    Verlyn Goad, MD

## 2023-09-21 NOTE — Patient Instructions (Signed)

## 2023-09-22 ENCOUNTER — Ambulatory Visit: Payer: Self-pay | Admitting: Obstetrics and Gynecology

## 2023-09-22 ENCOUNTER — Other Ambulatory Visit: Payer: Self-pay

## 2023-09-22 ENCOUNTER — Encounter: Payer: Self-pay | Admitting: Obstetrics and Gynecology

## 2023-09-22 DIAGNOSIS — O99013 Anemia complicating pregnancy, third trimester: Secondary | ICD-10-CM | POA: Insufficient documentation

## 2023-09-22 DIAGNOSIS — B379 Candidiasis, unspecified: Secondary | ICD-10-CM

## 2023-09-22 DIAGNOSIS — Z3480 Encounter for supervision of other normal pregnancy, unspecified trimester: Secondary | ICD-10-CM

## 2023-09-22 LAB — CBC
Hematocrit: 31.1 % — ABNORMAL LOW (ref 34.0–46.6)
Hemoglobin: 9.9 g/dL — ABNORMAL LOW (ref 11.1–15.9)
MCH: 29.1 pg (ref 26.6–33.0)
MCHC: 31.8 g/dL (ref 31.5–35.7)
MCV: 92 fL (ref 79–97)
Platelets: 164 10*3/uL (ref 150–450)
RBC: 3.4 x10E6/uL — ABNORMAL LOW (ref 3.77–5.28)
RDW: 12.3 % (ref 11.7–15.4)
WBC: 7.3 10*3/uL (ref 3.4–10.8)

## 2023-09-22 LAB — CERVICOVAGINAL ANCILLARY ONLY
Bacterial Vaginitis (gardnerella): NEGATIVE
Candida Glabrata: NEGATIVE
Candida Vaginitis: NEGATIVE
Chlamydia: NEGATIVE
Comment: NEGATIVE
Comment: NEGATIVE
Comment: NEGATIVE
Comment: NEGATIVE
Comment: NEGATIVE
Comment: NORMAL
Neisseria Gonorrhea: NEGATIVE
Trichomonas: NEGATIVE

## 2023-09-22 LAB — HIV ANTIBODY (ROUTINE TESTING W REFLEX): HIV Screen 4th Generation wRfx: NONREACTIVE

## 2023-09-22 LAB — RPR: RPR Ser Ql: NONREACTIVE

## 2023-09-22 LAB — GLUCOSE TOLERANCE, 2 HOURS W/ 1HR
Glucose, 1 hour: 135 mg/dL (ref 70–179)
Glucose, 2 hour: 73 mg/dL (ref 70–152)
Glucose, Fasting: 66 mg/dL — ABNORMAL LOW (ref 70–91)

## 2023-09-22 MED ORDER — TERCONAZOLE 0.8 % VA CREA: 1.0000 | TOPICAL_CREAM | Freq: Every day | VAGINAL | 0 refills | Status: DC

## 2023-09-22 MED ORDER — ACCRUFER 30 MG PO CAPS: 1.0000 | ORAL_CAPSULE | Freq: Every day | ORAL | 3 refills | Status: DC

## 2023-09-28 ENCOUNTER — Other Ambulatory Visit: Payer: Self-pay

## 2023-09-28 ENCOUNTER — Other Ambulatory Visit: Payer: Self-pay | Admitting: *Deleted

## 2023-09-28 ENCOUNTER — Ambulatory Visit

## 2023-09-28 ENCOUNTER — Ambulatory Visit: Attending: Obstetrics and Gynecology | Admitting: Obstetrics and Gynecology

## 2023-09-28 VITALS — BP 121/78

## 2023-09-28 DIAGNOSIS — O99013 Anemia complicating pregnancy, third trimester: Secondary | ICD-10-CM | POA: Diagnosis not present

## 2023-09-28 DIAGNOSIS — O365920 Maternal care for other known or suspected poor fetal growth, second trimester, not applicable or unspecified: Secondary | ICD-10-CM

## 2023-09-28 DIAGNOSIS — D649 Anemia, unspecified: Secondary | ICD-10-CM

## 2023-09-28 DIAGNOSIS — Z3A29 29 weeks gestation of pregnancy: Secondary | ICD-10-CM | POA: Diagnosis not present

## 2023-09-28 DIAGNOSIS — O365930 Maternal care for other known or suspected poor fetal growth, third trimester, not applicable or unspecified: Secondary | ICD-10-CM | POA: Diagnosis not present

## 2023-09-28 DIAGNOSIS — Z362 Encounter for other antenatal screening follow-up: Secondary | ICD-10-CM

## 2023-09-28 DIAGNOSIS — Z363 Encounter for antenatal screening for malformations: Secondary | ICD-10-CM | POA: Insufficient documentation

## 2023-09-28 DIAGNOSIS — Z3480 Encounter for supervision of other normal pregnancy, unspecified trimester: Secondary | ICD-10-CM

## 2023-09-28 MED ORDER — ACCRUFER 30 MG PO CAPS: 1.0000 | ORAL_CAPSULE | Freq: Every day | ORAL | 3 refills | Status: DC

## 2023-09-28 MED ORDER — ACCRUFER 30 MG PO CAPS: 1.0000 | ORAL_CAPSULE | Freq: Every day | ORAL | 3 refills | Status: AC

## 2023-09-28 NOTE — Progress Notes (Signed)
 Maternal-Fetal Medicine Consultation Name: Tal Neer MRN: [id number]  G1 P0 at 29w 4d gestation.  Patient is here for fetal growth assessment.  Her prepregnancy weight was 105 pounds. She does not have gestational diabetes.  Blood pressure today at our office is 121/78 mmHg.  Ultrasound Fetal growth is appropriate for gestational age.  Amniotic fluid is normal good fetal activity seen.  The estimated fetal weight is at the 11 percentile and the abdominal circumference measurement is at the 12 percentile.  Umbilical artery Doppler showed normal forward diastolic flow.  Patient is anemia that is most likely iron deficient anemia.  The recent hemoglobin was 9.9 g/dL but the MCV was normal.  Patient is yet to start iron supplements. I encouraged her to begin iron supplements so that her anemia is corrected by that time she delivers. I reassured the patient that she has a constitutionally small fetus not consistent with fetal growth restriction.  Recommendations -An appointment was made for her to return in 4 weeks for fetal growth assessment.  Consultation including face-to-face (more than 50%) counseling 10 minutes.

## 2023-09-29 ENCOUNTER — Encounter: Payer: Self-pay | Admitting: Obstetrics and Gynecology

## 2023-10-05 ENCOUNTER — Encounter: Payer: Self-pay | Admitting: Obstetrics

## 2023-10-05 ENCOUNTER — Ambulatory Visit (INDEPENDENT_AMBULATORY_CARE_PROVIDER_SITE_OTHER): Admitting: Obstetrics

## 2023-10-05 VITALS — BP 125/85 | HR 96 | Wt 132.3 lb

## 2023-10-05 DIAGNOSIS — O99013 Anemia complicating pregnancy, third trimester: Secondary | ICD-10-CM | POA: Diagnosis not present

## 2023-10-05 DIAGNOSIS — Z3480 Encounter for supervision of other normal pregnancy, unspecified trimester: Secondary | ICD-10-CM | POA: Diagnosis not present

## 2023-10-05 NOTE — Progress Notes (Signed)
 Pt presents for ROB. Pain in right heel with walking, worse in the AM ~2 weeks. Has not tried any modalities.

## 2023-10-05 NOTE — Progress Notes (Signed)
 Subjective:  Tracy Nichols is a 23 y.o. G1P0000 at [redacted]w[redacted]d being seen today for ongoing prenatal care.  She is currently monitored for the following issues for this low-risk pregnancy and has Supervision of other normal pregnancy, antepartum and Anemia of mother in pregnancy, antepartum, third trimester on their problem list.  Patient reports heartburn.  Contractions: Irritability. Vag. Bleeding: None.  Movement: Present. Denies leaking of fluid.   The following portions of the patient's history were reviewed and updated as appropriate: allergies, current medications, past family history, past medical history, past social history, past surgical history and problem list. Problem list updated.  Objective:   Vitals:   10/05/23 1550  BP: 125/85  Pulse: 96  Weight: 132 lb 4.8 oz (60 kg)    Fetal Status: Fetal Heart Rate (bpm): 136   Movement: Present     General:  Alert, oriented and cooperative. Patient is in no acute distress.  Skin: Skin is warm and dry. No rash noted.   Cardiovascular: Normal heart rate noted  Respiratory: Normal respiratory effort, no problems with respiration noted  Abdomen: Soft, gravid, appropriate for gestational age. Pain/Pressure: Present     Pelvic:  Cervical exam deferred        Extremities: Normal range of motion.  Edema: Trace  Mental Status: Normal mood and affect. Normal behavior. Normal judgment and thought content.   Urinalysis:      Assessment and Plan:  Pregnancy: G1P0000 at [redacted]w[redacted]d  1. Supervision of other normal pregnancy, antepartum (Primary)  2. Anemia of mother in pregnancy, antepartum, third trimester   Preterm labor symptoms and general obstetric precautions including but not limited to vaginal bleeding, contractions, leaking of fluid and fetal movement were reviewed in detail with the patient. Please refer to After Visit Summary for other counseling recommendations.   Return in about 2 weeks (around 10/19/2023) for ROB.   Gabrielle Joiner, MD 16/16/2025

## 2023-10-12 ENCOUNTER — Ambulatory Visit (INDEPENDENT_AMBULATORY_CARE_PROVIDER_SITE_OTHER): Payer: Self-pay | Admitting: Licensed Clinical Social Worker

## 2023-10-12 DIAGNOSIS — F4320 Adjustment disorder, unspecified: Secondary | ICD-10-CM

## 2023-10-12 NOTE — BH Specialist Note (Unsigned)
 Integrated Behavioral Health via Telemedicine Visit  10/15/2023 Tracy Nichols [id number]  Number of Integrated Behavioral Health Clinician visits: 1- Initial Visit  Session Start time: 1514  Session End time: 1530  Total time in minutes: 16    Referring Provider:  Patient/Family location: Home   [facility] Provider location: Remote Office All persons participating in visit: Patient and [facility] Types of Service: Individual psychotherapy and Video visit  I connected with Tracy Nichols and/or Tracy Nichols's patient via  Telephone or Engineer, civil (consulting)  (Video is Surveyor, mining) and verified that I am speaking with the correct person using two identifiers. Discussed confidentiality: Yes   I discussed the limitations of telemedicine and the availability of in person appointments.  Discussed there is a possibility of technology failure and discussed alternative modes of communication if that failure occurs.  I discussed that engaging in this telemedicine visit, they consent to the provision of behavioral healthcare and the services will be billed under their insurance.  Patient and/or legal guardian expressed understanding and consented to Telemedicine visit: Yes   Presenting Concerns: Patient and/or family reports the following symptoms/concerns: No concerns  Duration of problem: months; Severity of problem: mild  Patient and/or Family's Strengths/Protective Factors: Sense of purpose  Goals Addressed: Patient will:  Reduce symptoms of: stress   Increase knowledge and/or ability of: coping skills and healthy habits   Demonstrate ability to: Increase healthy adjustment to current life circumstances  Progress towards Goals: Discontinued    Interventions: Interventions utilized:  Supportive Counseling, Psychoeducation and/or Health Education, and Supportive Reflection Standardized Assessments completed: Not Needed    Patient and/or Family Response: The  patient was present for today's virtual behavioral health session. 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 The patient reported experiencing recent stress related to balancing work and school while managing the physical and emotional demands of pregnancy. She noted that now, having graduated and reduced her work hours to part time, her stress has decreased and she is not currently experiencing symptoms of depression or anxiety. The patient expressed that she did not feel the behavioral health appointment was necessary and is not interested in ongoing behavioral health services at this time. However, she did express interest in doula support, and a self-referral link was provided.   Clinical Assessment/Diagnosis  Adjustment disorder, unspecified type    Assessment: Patient currently experiencing  reduced stress following a recent graduation and transition to part-time work but does not currently report symptoms of depression or anxiety. She is focused on her pregnancy and future educational goals..   Patient may benefit from continued [facility] services if needed. .  Plan: Follow up with behavioral health clinician on : No follow up scheduled.  Behavioral recommendations:patient to remain mindful of her stress levels during pregnancy and to re-engage in behavioral health services if symptoms arise. Referral(s): Integrated [employer] (In Clinic)  I discussed the assessment and treatment plan with the patient and/or parent/guardian. They were provided an opportunity to ask questions and all were answered. They agreed with the plan and demonstrated an understanding of the instructions.   They were advised to call back or seek an in-person evaluation if the symptoms worsen or if the condition fails to improve as anticipated.  Blessing Zaucha LITTIE Nichols, LCSWA

## 2023-10-19 ENCOUNTER — Ambulatory Visit (INDEPENDENT_AMBULATORY_CARE_PROVIDER_SITE_OTHER): Admitting: Obstetrics

## 2023-10-19 ENCOUNTER — Encounter: Payer: Self-pay | Admitting: Obstetrics

## 2023-10-19 VITALS — BP 117/76 | HR 89 | Wt 135.0 lb

## 2023-10-19 DIAGNOSIS — Z3480 Encounter for supervision of other normal pregnancy, unspecified trimester: Secondary | ICD-10-CM

## 2023-10-19 DIAGNOSIS — O99013 Anemia complicating pregnancy, third trimester: Secondary | ICD-10-CM | POA: Diagnosis not present

## 2023-10-19 DIAGNOSIS — F4320 Adjustment disorder, unspecified: Secondary | ICD-10-CM

## 2023-10-19 MED ORDER — VITAFOL ULTRA 29-0.6-0.4-200 MG PO CAPS: 1.0000 | ORAL_CAPSULE | Freq: Every day | ORAL | 4 refills | Status: DC

## 2023-10-19 NOTE — Progress Notes (Signed)
 Subjective:  Tracy Nichols is a 23 y.o. G1P0000 at [redacted]w[redacted]d being seen today for ongoing prenatal care.  She is currently monitored for the following issues for this low-risk pregnancy and has Supervision of other normal pregnancy, antepartum and Anemia of mother in pregnancy, antepartum, third trimester on their problem list.  Patient reports heartburn.  Contractions: Irritability. Vag. Bleeding: None.  Movement: Present. Denies leaking of fluid.   The following portions of the patient's history were reviewed and updated as appropriate: allergies, current medications, past family history, past medical history, past social history, past surgical history and problem list. Problem list updated.  Objective:   Vitals:   10/19/23 1551  BP: 117/76  Pulse: 89  Weight: 135 lb (61.2 kg)    Fetal Status: Fetal Heart Rate (bpm): 141   Movement: Present     General:  Alert, oriented and cooperative. Patient is in no acute distress.  Skin: Skin is warm and dry. No rash noted.   Cardiovascular: Normal heart rate noted  Respiratory: Normal respiratory effort, no problems with respiration noted  Abdomen: Soft, gravid, appropriate for gestational age. Pain/Pressure: Present (occassional)     Pelvic:  Cervical exam deferred        Extremities: Normal range of motion.  Edema: Trace (legs and feet)  Mental Status: Normal mood and affect. Normal behavior. Normal judgment and thought content.   Urinalysis:      Assessment and Plan:  Pregnancy: G1P0000 at [redacted]w[redacted]d  1. Supervision of other normal pregnancy, antepartum (Primary) Rx: - Prenat-Fe Poly-Methfol-FA-DHA (VITAFOL ULTRA) 29-0.6-0.4-200 MG CAPS; Take 1 capsule by mouth daily before breakfast.  Dispense: 90 capsule; Refill: 4  2. Anemia of mother in pregnancy, antepartum, third trimester - Accrufer  Rx  3. Adjustment disorder, unspecified type - clinically stable    Preterm labor symptoms and general obstetric precautions including but not limited  to vaginal bleeding, contractions, leaking of fluid and fetal movement were reviewed in detail with the patient. Please refer to After Visit Summary for other counseling recommendations.   Return in about 2 weeks (around 11/02/2023) for ROB.   Rudy Carlin LABOR, MD 10/19/2023

## 2023-10-19 NOTE — Progress Notes (Signed)
 Pt presents for ROB. Not taking a prenatal. Wanting to know if baby is head down.

## 2023-10-21 ENCOUNTER — Other Ambulatory Visit: Payer: Self-pay

## 2023-10-21 MED ORDER — VITAFOL GUMMIES 3.33-0.333-34.8 MG PO CHEW: 1.0000 | CHEWABLE_TABLET | Freq: Every day | ORAL | 5 refills | Status: DC

## 2023-10-26 ENCOUNTER — Encounter: Payer: Self-pay | Admitting: Obstetrics

## 2023-10-27 ENCOUNTER — Ambulatory Visit

## 2023-10-28 ENCOUNTER — Encounter: Payer: Self-pay | Admitting: Obstetrics

## 2023-10-28 ENCOUNTER — Encounter (HOSPITAL_COMMUNITY): Payer: Self-pay | Admitting: Obstetrics and Gynecology

## 2023-10-28 ENCOUNTER — Inpatient Hospital Stay (HOSPITAL_COMMUNITY)
Admission: AD | Admit: 2023-10-28 | Discharge: 2023-10-28 | Disposition: A | Discharge status: Home / Self Care | Attending: Obstetrics and Gynecology | Admitting: Obstetrics and Gynecology

## 2023-10-28 DIAGNOSIS — O163 Unspecified maternal hypertension, third trimester: Secondary | ICD-10-CM | POA: Insufficient documentation

## 2023-10-28 DIAGNOSIS — R519 Headache, unspecified: Secondary | ICD-10-CM

## 2023-10-28 DIAGNOSIS — O26893 Other specified pregnancy related conditions, third trimester: Secondary | ICD-10-CM | POA: Diagnosis present

## 2023-10-28 DIAGNOSIS — I10 Essential (primary) hypertension: Secondary | ICD-10-CM

## 2023-10-28 DIAGNOSIS — Z3A33 33 weeks gestation of pregnancy: Secondary | ICD-10-CM

## 2023-10-28 LAB — COMPREHENSIVE METABOLIC PANEL WITH GFR
ALT: 13 U/L (ref 0–44)
AST: 16 U/L (ref 15–41)
Albumin: 2.5 g/dL — ABNORMAL LOW (ref 3.5–5.0)
Alkaline Phosphatase: 117 U/L (ref 38–126)
Anion gap: 11 (ref 5–15)
BUN: <5 mg/dL — ABNORMAL LOW (ref 6–20)
CO2: 19 mmol/L — ABNORMAL LOW (ref 22–32)
Calcium: 9 mg/dL (ref 8.9–10.3)
Chloride: 106 mmol/L (ref 98–111)
Creatinine, Ser: 0.52 mg/dL (ref 0.44–1.00)
GFR, Estimated: >60 mL/min (ref >60)
Glucose, Bld: 72 mg/dL (ref 70–99)
Potassium: 3.8 mmol/L (ref 3.5–5.1)
Sodium: 136 mmol/L (ref 135–145)
Total Bilirubin: 0.4 mg/dL (ref 0.0–1.2)
Total Protein: 5.6 g/dL — ABNORMAL LOW (ref 6.5–8.1)

## 2023-10-28 LAB — URINALYSIS, ROUTINE W REFLEX MICROSCOPIC
Bilirubin Urine: NEGATIVE
Glucose, UA: NEGATIVE mg/dL
Hgb urine dipstick: NEGATIVE
Ketones, ur: NEGATIVE mg/dL
Nitrite: NEGATIVE
Protein, ur: NEGATIVE mg/dL
Specific Gravity, Urine: 1.006 (ref 1.005–1.030)
pH: 7 (ref 5.0–8.0)

## 2023-10-28 LAB — CBC
HCT: 27.4 % — ABNORMAL LOW (ref 36.0–46.0)
Hemoglobin: 8.9 g/dL — ABNORMAL LOW (ref 12.0–15.0)
MCH: 27.3 pg (ref 26.0–34.0)
MCHC: 32.5 g/dL (ref 30.0–36.0)
MCV: 84 fL (ref 80.0–100.0)
Platelets: 175 K/uL (ref 150–400)
RBC: 3.26 MIL/uL — ABNORMAL LOW (ref 3.87–5.11)
RDW: 12.6 % (ref 11.5–15.5)
WBC: 8.9 K/uL (ref 4.0–10.5)
nRBC: 0 % (ref 0.0–0.2)

## 2023-10-28 LAB — PROTEIN / CREATININE RATIO, URINE
Creatinine, Urine: 39 mg/dL
Total Protein, Urine: <6 mg/dL

## 2023-10-28 MED ORDER — ACETAMINOPHEN-CAFFEINE 500-65 MG PO TABS
2.0000 | ORAL_TABLET | Freq: Once | ORAL | Status: AC
  Administered 2023-10-28: 2 via ORAL
  Filled 2023-10-28: qty 2

## 2023-10-28 NOTE — MAU Provider Note (Signed)
 History     CSN: [id number]  Arrival date and time: 10/28/23 0931   Event Date/Time   First Provider Initiated Contact with Patient 10/28/23 1044      Chief Complaint  Patient presents with   Headache   HPI Tracy Nichols is a 23 y.o. year old G34P0000 female at [redacted]w[redacted]d weeks gestation who presents to MAU reporting a H/A since Monday 10/26/2023. She describes the H/A as being LT temporal. She reports that Tylenol  regular strength without relief; last dose was last night. She took her BP yesterday and it was 132/80; which is higher than her normal. She also reports increased swelling in her legs and feet. She reports (+) FM. She denies visual disturbances.  OB History     Gravida  1   Para  0   Term  0   Preterm  0   AB  0   Living  0      SAB  0   IAB  0   Ectopic  0   Multiple  0   Live Births  0           Past Medical History:  Diagnosis Date   Medical history non-contributory     Past Surgical History:  Procedure Laterality Date   NO PAST SURGERIES      History reviewed. No pertinent family history.  Social History   Tobacco Use   Smoking status: Never   Smokeless tobacco: Never  Vaping Use   Vaping status: Never Used  Substance Use Topics   Alcohol use: Not Currently   Drug use: Not Currently    Types: Marijuana    Allergies: No Known Allergies  Medications Prior to Admission  Medication Sig Dispense Refill Last Dose/Taking   Ferric Maltol  (ACCRUFER ) 30 MG CAPS Take 1 capsule (30 mg total) by mouth daily with breakfast. 30 capsule 3 10/27/2023   hydrocortisone  cream 0.5 % Apply 1 Application topically 2 (two) times daily. Apply thin film (Patient not taking: Reported on 10/19/2023) 30 g 0    Prenatal Vit-Fe Fumarate-FA (PRENATAL VITAMIN PO) Take 1 tablet by mouth daily. (Patient not taking: Reported on 10/19/2023)      Prenatal Vit-Fe Phos-FA-Omega (VITAFOL  GUMMIES) 3.33-0.333-34.8 MG CHEW Chew 1 tablet by mouth daily. (Patient not taking:  Reported on 10/28/2023) 90 tablet 5 Not Taking   terconazole  (TERAZOL 3 ) 0.8 % vaginal cream Place 1 applicator vaginally at bedtime. Apply nightly for three nights. (Patient not taking: Reported on 10/05/2023) 20 g 0    tiZANidine  (ZANAFLEX ) 4 MG tablet Take 1 tablet (4 mg total) by mouth every 6 (six) hours as needed for muscle spasms. (Patient not taking: Reported on 10/19/2023) 20 tablet 0     Review of Systems  Constitutional: Negative.   HENT: Negative.    Eyes: Negative.   Respiratory: Negative.    Cardiovascular:  Positive for leg swelling.  Gastrointestinal: Negative.   Endocrine: Negative.   Genitourinary: Negative.   Musculoskeletal: Negative.   Skin: Negative.   Allergic/Immunologic: Negative.   Neurological:  Positive for headaches.  Hematological: Negative.   Psychiatric/Behavioral: Negative.     Physical Exam   Patient Vitals for the past 24 hrs:  BP Temp Pulse Resp SpO2 Height Weight  10/28/23 1216 126/84 -- 89 -- -- -- --  10/28/23 1200 122/76 -- 90 -- 100 % -- --  10/28/23 1145 122/75 -- 90 -- 100 % -- --  10/28/23 1130 129/78 -- 94 16 100 % -- --  10/28/23 1115 130/83 -- (!) 101 -- 100 % -- --  10/28/23 1100 129/86 -- 97 18 100 % -- --  10/28/23 1045 132/83 -- (!) 101 -- 100 % -- --  10/28/23 1031 139/84 -- (!) 103 16 -- -- --  10/28/23 1016 (!) 136/97 -- 100 16 -- -- --  10/28/23 1007 136/88 -- (!) 108 -- -- -- --  10/28/23 0952 139/70 98.4 F (36.9 C) (!) 111 18 -- 5' 1 (1.549 m) 62.6 kg    Physical Exam Vitals and nursing note reviewed.  Constitutional:      Appearance: Normal appearance. She is normal weight.  Cardiovascular:     Rate and Rhythm: Normal rate and regular rhythm.     Heart sounds: Normal heart sounds.  Pulmonary:     Effort: Pulmonary effort is normal.     Breath sounds: Normal breath sounds.  Abdominal:     General: Bowel sounds are normal.     Palpations: Abdomen is soft.  Genitourinary:    Comments: Not  indicated Musculoskeletal:        General: Normal range of motion.  Skin:    General: Skin is warm and dry.  Neurological:     Mental Status: She is alert and oriented to person, place, and time.  Psychiatric:        Mood and Affect: Mood normal.        Behavior: Behavior normal.        Thought Content: Thought content normal.        Judgment: Judgment normal.    REACTIVE NST - FHR: 145 bpm / moderate variability / accels present / decels absent / TOCO: UI noted  MAU Course  Procedures  MDM CCUA CBC CMP P/C Ratio Serial BP's  Excedrin Tension H/A -- H/A resolved  Results for orders placed or performed during the hospital encounter of 10/28/23 (from the past 24 hours)  Urinalysis, Routine w reflex microscopic -Urine, Clean Catch     Status: Abnormal   Collection Time: 10/28/23 11:00 AM  Result Value Ref Range   Color, Urine STRAW (A) YELLOW   APPearance CLEAR CLEAR   Specific Gravity, Urine 1.006 1.005 - 1.030   pH 7.0 5.0 - 8.0   Glucose, UA NEGATIVE NEGATIVE mg/dL   Hgb urine dipstick NEGATIVE NEGATIVE   Bilirubin Urine NEGATIVE NEGATIVE   Ketones, ur NEGATIVE NEGATIVE mg/dL   Protein, ur NEGATIVE NEGATIVE mg/dL   Nitrite NEGATIVE NEGATIVE   Leukocytes,Ua TRACE (A) NEGATIVE   RBC / HPF 0-5 0 - 5 RBC/hpf   WBC, UA 0-5 0 - 5 WBC/hpf   Bacteria, UA RARE (A) NONE SEEN   Squamous Epithelial / HPF 0-5 0 - 5 /HPF  Protein / creatinine ratio, urine     Status: None   Collection Time: 10/28/23 11:00 AM  Result Value Ref Range   Creatinine, Urine 39 mg/dL   Total Protein, Urine <6 mg/dL   Protein Creatinine Ratio        0.00 - 0.15 mg/mg[Cre]  CBC     Status: Abnormal   Collection Time: 10/28/23 11:19 AM  Result Value Ref Range   WBC 8.9 4.0 - 10.5 K/uL   RBC 3.26 (L) 3.87 - 5.11 MIL/uL   Hemoglobin 8.9 (L) 12.0 - 15.0 g/dL   HCT 72.5 (L) 63.9 - 53.9 %   MCV 84.0 80.0 - 100.0 fL   MCH 27.3 26.0 - 34.0 pg   MCHC 32.5 30.0 - 36.0 g/dL  RDW 12.6 11.5 - 15.5 %    Platelets 175 150 - 400 K/uL   nRBC 0.0 0.0 - 0.2 %  Comprehensive metabolic panel with GFR     Status: Abnormal   Collection Time: 10/28/23 11:19 AM  Result Value Ref Range   Sodium 136 135 - 145 mmol/L   Potassium 3.8 3.5 - 5.1 mmol/L   Chloride 106 98 - 111 mmol/L   CO2 19 (L) 22 - 32 mmol/L   Glucose, Bld 72 70 - 99 mg/dL   BUN <5 (L) 6 - 20 mg/dL   Creatinine, Ser 9.47 0.44 - 1.00 mg/dL   Calcium 9.0 8.9 - 89.6 mg/dL   Total Protein 5.6 (L) 6.5 - 8.1 g/dL   Albumin 2.5 (L) 3.5 - 5.0 g/dL   AST 16 15 - 41 U/L   ALT 13 0 - 44 U/L   Alkaline Phosphatase 117 38 - 126 U/L   Total Bilirubin 0.4 0.0 - 1.2 mg/dL   GFR, Estimated >39 >39 mL/min   Anion gap 11 5 - 15      Assessment and Plan  1. Headache in pregnancy, antepartum, third trimester (Primary) - Advised to purchase Excedrin Tension H/A caplets as directed on box  2. Elevated blood pressure reading with diagnosis of hypertension - BP WNL in MAU  3. [redacted] weeks gestation of pregnancy   - Discharge home - Keep scheduled appt with MFM and [facility] on 7/16 & 7/17 - Patient verbalized an understanding of the plan of care and agrees.   Ala Cart, CNM 10/28/2023, 10:46 AM

## 2023-10-28 NOTE — Discharge Instructions (Signed)
    [facility]     [facility]                    [facility]                 [facility]    **Purchase ONE of these store-brand Excedrin Tension Headache medications. Take it as directed for relief of headache. DO NOT TAKE AT THE SAME TIME AS TYLENOL /ACETAMINOPHEN **

## 2023-10-28 NOTE — MAU Note (Signed)
 Tracy Nichols is a 23 y.o. at [redacted]w[redacted]d here in MAU reporting: headache on her left temporal area since Monday . Has taken tylenol  without relief. Took her b/p yesterday and it was 132/80 and that is higher than normal for her. Stated she has had increased swelling to her legs and feet. Good fetal movement felt. No visual changes.   LMP:  Onset of complaint: Monday  Pain score: 6 Vitals:   10/28/23 0952  BP: 139/70  Pulse: (!) 111  Resp: 18  Temp: 98.4 F (36.9 C)     FHT: 145  Lab orders placed from triage:

## 2023-11-03 ENCOUNTER — Other Ambulatory Visit: Payer: Self-pay

## 2023-11-03 ENCOUNTER — Other Ambulatory Visit: Payer: Self-pay | Admitting: Obstetrics and Gynecology

## 2023-11-03 ENCOUNTER — Ambulatory Visit: Attending: Obstetrics | Admitting: Obstetrics

## 2023-11-03 ENCOUNTER — Ambulatory Visit (HOSPITAL_BASED_OUTPATIENT_CLINIC_OR_DEPARTMENT_OTHER)

## 2023-11-03 VITALS — BP 135/76 | HR 104

## 2023-11-03 DIAGNOSIS — O99013 Anemia complicating pregnancy, third trimester: Secondary | ICD-10-CM

## 2023-11-03 DIAGNOSIS — Z3A34 34 weeks gestation of pregnancy: Secondary | ICD-10-CM | POA: Insufficient documentation

## 2023-11-03 DIAGNOSIS — Z362 Encounter for other antenatal screening follow-up: Secondary | ICD-10-CM | POA: Diagnosis present

## 2023-11-03 DIAGNOSIS — O365930 Maternal care for other known or suspected poor fetal growth, third trimester, not applicable or unspecified: Secondary | ICD-10-CM | POA: Diagnosis not present

## 2023-11-03 DIAGNOSIS — Z3480 Encounter for supervision of other normal pregnancy, unspecified trimester: Secondary | ICD-10-CM

## 2023-11-03 NOTE — Progress Notes (Signed)
 MFM Consult Note  Tracy Nichols is currently at 34 weeks and 5 days.  She has been followed as a small for gestational age fetus was noted during her last ultrasound exam.  She denies any problems since her last exam and reports feeling fetal movements throughout the day.  On today's exam, the EFW of 4 pounds 9 ounces measures at the 8th percentile for her gestational age indicating IUGR.   The total AFI was 16.01 cm (within normal limits).  A BPP performed today was 8 out of 8.    Doppler studies of the umbilical arteries showed a normal S/D ratio of 2.27 .  There were no signs of absent or reversed end-diastolic flow.    The patient and her mother were reassured that IUGR is a common finding.  Most cases of IUGR result in the delivery of a healthy infant at or close to term.  Due to IUGR, we will continue to follow her with weekly fetal testing and umbilical artery Doppler studies.    We will reassess the fetal growth again in 3 weeks.    Should IUGR continue to be noted at her next growth scan, delivery will be recommended at around 38 weeks.  She will return in 1 week for a BPP and umbilical artery Doppler study.    The patient stated that all of her questions were answered today.  A total of 20 minutes was spent counseling and coordinating the care for this patient.  Greater than 50% of the time was spent in direct face-to-face contact.

## 2023-11-05 ENCOUNTER — Other Ambulatory Visit (HOSPITAL_COMMUNITY)
Admission: RE | Admit: 2023-11-05 | Discharge: 2023-11-05 | Disposition: A | Discharge status: Home / Self Care | Source: Ambulatory Visit | Attending: Obstetrics and Gynecology | Admitting: Obstetrics and Gynecology

## 2023-11-05 ENCOUNTER — Ambulatory Visit: Admitting: Obstetrics and Gynecology

## 2023-11-05 VITALS — BP 134/86 | HR 101 | Wt 139.1 lb

## 2023-11-05 DIAGNOSIS — Z3480 Encounter for supervision of other normal pregnancy, unspecified trimester: Secondary | ICD-10-CM

## 2023-11-05 DIAGNOSIS — N898 Other specified noninflammatory disorders of vagina: Secondary | ICD-10-CM

## 2023-11-05 DIAGNOSIS — F4320 Adjustment disorder, unspecified: Secondary | ICD-10-CM | POA: Diagnosis not present

## 2023-11-05 DIAGNOSIS — O99013 Anemia complicating pregnancy, third trimester: Secondary | ICD-10-CM

## 2023-11-05 DIAGNOSIS — O365930 Maternal care for other known or suspected poor fetal growth, third trimester, not applicable or unspecified: Secondary | ICD-10-CM | POA: Diagnosis not present

## 2023-11-05 DIAGNOSIS — Z3A35 35 weeks gestation of pregnancy: Secondary | ICD-10-CM

## 2023-11-05 NOTE — Progress Notes (Signed)
   PRENATAL VISIT NOTE  Subjective:  Tracy Nichols is a 23 y.o. G1P0000 at [redacted]w[redacted]d being seen today for ongoing prenatal care.  She is currently monitored for the following issues for this low-risk pregnancy and has Supervision of other normal pregnancy, antepartum and Anemia of mother in pregnancy, antepartum, third trimester on their problem list.  Patient reports vaginal irritation.  Contractions: Irritability. Vag. Bleeding: None.  Movement: Present. Denies leaking of fluid.   The following portions of the patient's history were reviewed and updated as appropriate: allergies, current medications, past family history, past medical history, past social history, past surgical history and problem list.   Objective:    Vitals:   11/05/23 1622  BP: 134/86  Pulse: (!) 101  Weight: 139 lb 1.6 oz (63.1 kg)    Fetal Status:  Fetal Heart Rate (bpm): 140   Movement: Present    General: Alert, oriented and cooperative. Patient is in no acute distress.  Skin: Skin is warm and dry. No rash noted.   Cardiovascular: Normal heart rate noted  Respiratory: Normal respiratory effort, no problems with respiration noted  Abdomen: Soft, gravid, appropriate for gestational age.  Pain/Pressure: Present     Pelvic: Cervical exam deferred        Extremities: Normal range of motion.  Edema: Trace (legs)  Mental Status: Normal mood and affect. Normal behavior. Normal judgment and thought content.   Assessment and Plan:  Pregnancy: G1P0000 at [redacted]w[redacted]d 1. Supervision of other normal pregnancy, antepartum (Primary) BP and FHR normal Doing well, feeling regular movement    2. Anemia of mother in pregnancy, antepartum, third trimester 7/9 hgb 8.9, has been taking iron supplement with no improvement. Discussed iron infusions, order placed today   3. Adjustment disorder, unspecified type   4. Poor fetal growth affecting management of mother in third trimester, single or unspecified fetus 7/15u/s afi normal,  BPP 8/8, EFW 8%, normal dopplers, if continues, delivery around 38 weeks per MFM Continue weekly BPP and doppler  5. Vaginal irritation Swab collected  - Cervicovaginal ancillary only( [place])  6. [redacted] weeks gestation of pregnancy Anticipatory guidance regarding gbs swab next visit Peds list given today    Preterm labor symptoms and general obstetric precautions including but not limited to vaginal bleeding, contractions, leaking of fluid and fetal movement were reviewed in detail with the patient. Please refer to After Visit Summary for other counseling recommendations.    Nidia Daring, FNP

## 2023-11-05 NOTE — Progress Notes (Signed)
 Pt presents for rob. Pt has no questions or concerns at this time.

## 2023-11-05 NOTE — Patient Instructions (Signed)
 [facility] Pediatric Providers  [place] ([id number])  [facility]) - [facility], MD; Manson Passey, MD; Ave Filter, MD; Luna Fuse, MD; Kennedy Bucker, MD; Florestine Avers, MD; Melchor Amour, MD; Yetta Barre,  MD; Konrad Dolores, MD; Kathlene November, MD; Jenne Campus, MD; Wynetta Emery, MD; Duffy Rhody, MD; Gerre Couch, NP [address] [place], [place], [place] [address] [phone] Mon, Tue, Thur, Fri 8:30-5:30, Wed 9:30-5:30, Sat 8:30-12:30 Only accepting infants of first-time parents or siblings of current patients Hospital discharge coordinator will make follow-up appointment Medicaid - yes; Tricare - yes   [facility] - Pediatrics at [place] Radon, MD; Sabino Dick, MD; Quitman Livings, MD; Betha Loa, NP; Claretha Cooper, MD; Lelon Perla, MD [address] [place] [place], [place] [address] [phone] Mon-Fri 8:30-5:30 Medicaid - yes, Tricare - yes  [place] [phone]) [facility], MD [address], [place], [place] [address] [phone] Mon, Tues, Wed Fri 8:30-5:00, Sat 8:30-12:00, Closed Thursdays Accepting siblings of established patients and first time mom's if you call prenatally Medicaid- yes; Tricare - yes   [facility] [address], [place], [place] [address] [phone] Mon-Fri 8:30-5:00 (lunch 12:00-1:00) Medicaid -Yes; Tricare - Yes   [facility] [place], MD; [place], [place]; [place], [place]; [place], [place] [address] [place] [address] [phone] Mon-Fri 7:30-5:30 Medicaid - Yes; Sherolyn Buba  [place] [phone] & [phone])  [facility], MD [address] [place], [place] [address] [phone] Mon-Thur 8:00-6:00, closed for lunch 12-2, closed Fridays Medicaid - yes; Tricare - no  [facility] Dareen Piano, NP; Cyndia Bent, MD; [place], [place]; [place], [place] [address], [place],  [place] [address] [phone] Mon-Fri 7:30-4:30 Medicaid - yes, Tricare - yes  [facility]  Juanito Doom, MD; Janene Harvey, NP; Vonita Moss, MD; Donn Pierini, NP [address], [place], [place] [address] [phone] Mon-Fri 8:30-5:00, closed for lunch 1-2, Sat 8:30-12:00 - sick visits only Providers come to see babies at [facility] Only accepting newborns of siblings and first time parents ONLY if who have met with office prior to delivery Medicaid -Yes; Tricare - yes  [facility] - [place], [place]; Spero Geralds, NP; Earlene Plater, MD; Lucretia Roers, MD:  [address], [place], [place] [address] [phone] Mon- Fri 8:00-5:00, Sat 9:00-12:00 - sick visits only Accepting siblings of established patients and first time mom/baby Medicaid - Yes; Tricare - yes Patients must have vaccinations (baby vaccines)   [employer] [phone])  [facility] Alfredo Bach, PA; [place], [place]; Eddie Candle, MD; Normand Sloop, MD; [place], NP; [place], DO; [place], [place]; Constance Goltz, MD; Ruthann Cancer, MD; Vear Clock, MD; [place], [place]; [place], [place]; [place], [place] [address] [facility] [address], [place], [place] [address] [phone] Mon-Fri 8:30-5:00, Sat 9:00-12:00 - sick only Please register online [email or web address] then schedule online or call office Medicaid-Yes; Tricare -yes   [facility] - [facility] at [place] ([facility] [place] [place], [place]; [place], FNP; Berneda Rose, MD; Luther Redo, PA-C; Lavonia Drafts, MD; Kellie Simmering, FNP; Genevie Cheshire, FNP; Evaristo Bury, MD; Berneda Rose, MD [phone] [address] [place], [place] [address] [phone] Mon-Fri 8:30-5:30 Medicaid - Yes; Tricare - yes  [facility] - [address]  [place], [place]; Whitney Post, MD; Hennie Duos, MD; Wynne Dust, MD; [place], NP [address], [place], [place] [address] [phone] Mon-Thur 8:00-5:30, Fri 8:00-5:00, Sat 9:00-12:00 Medicaid - yes, Tricare - yes

## 2023-11-06 LAB — CERVICOVAGINAL ANCILLARY ONLY
Bacterial Vaginitis (gardnerella): NEGATIVE
Candida Glabrata: NEGATIVE
Candida Vaginitis: POSITIVE — AB
Chlamydia: NEGATIVE
Comment: NEGATIVE
Comment: NEGATIVE
Comment: NEGATIVE
Comment: NEGATIVE
Comment: NEGATIVE
Comment: NORMAL
Neisseria Gonorrhea: NEGATIVE
Trichomonas: NEGATIVE

## 2023-11-07 ENCOUNTER — Ambulatory Visit: Payer: Self-pay | Admitting: Obstetrics and Gynecology

## 2023-11-07 MED ORDER — TERCONAZOLE 0.8 % VA CREA: 1.0000 | TOPICAL_CREAM | Freq: Every day | VAGINAL | 0 refills | Status: DC

## 2023-11-10 ENCOUNTER — Encounter: Payer: Self-pay | Admitting: Obstetrics and Gynecology

## 2023-11-10 NOTE — Addendum Note (Signed)
 Addended by: DELORES NIDIA CROME on: 11/10/2023 11:48 AM   Modules accepted: Orders

## 2023-11-12 ENCOUNTER — Encounter: Payer: Self-pay | Admitting: Obstetrics and Gynecology

## 2023-11-12 ENCOUNTER — Ambulatory Visit: Admitting: Obstetrics and Gynecology

## 2023-11-12 VITALS — BP 123/82 | HR 99 | Wt 140.0 lb

## 2023-11-12 DIAGNOSIS — O365990 Maternal care for other known or suspected poor fetal growth, unspecified trimester, not applicable or unspecified: Secondary | ICD-10-CM

## 2023-11-12 DIAGNOSIS — O0993 Supervision of high risk pregnancy, unspecified, third trimester: Secondary | ICD-10-CM

## 2023-11-12 DIAGNOSIS — O99013 Anemia complicating pregnancy, third trimester: Secondary | ICD-10-CM | POA: Diagnosis not present

## 2023-11-12 DIAGNOSIS — Z3480 Encounter for supervision of other normal pregnancy, unspecified trimester: Secondary | ICD-10-CM

## 2023-11-12 DIAGNOSIS — Z3A36 36 weeks gestation of pregnancy: Secondary | ICD-10-CM

## 2023-11-12 NOTE — Progress Notes (Addendum)
 ROB/GBS/NST.  Needs 1st. PAP.

## 2023-11-12 NOTE — Progress Notes (Signed)
   PRENATAL VISIT NOTE  Subjective:  Tracy Nichols is a 23 y.o. G1P0000 at [redacted]w[redacted]d being seen today for ongoing prenatal care.  She is currently monitored for the following issues for this high-risk pregnancy and has Supervision of other normal pregnancy, antepartum; Anemia of mother in pregnancy, antepartum, third trimester; and Fetal growth restriction antepartum on their problem list.  Patient reports no complaints.  Contractions: Irritability. Vag. Bleeding: None.  Movement: Present. Denies leaking of fluid.   The following portions of the patient's history were reviewed and updated as appropriate: allergies, current medications, past family history, past medical history, past social history, past surgical history and problem list.   Objective:    Vitals:   11/12/23 1508  BP: 123/82  Pulse: 99  Weight: 140 lb (63.5 kg)    Fetal Status:  Fetal Heart Rate (bpm): NST Fundal Height: 35 cm Movement: Present    General: Alert, oriented and cooperative. Patient is in no acute distress.  Skin: Skin is warm and dry. No rash noted.   Cardiovascular: Normal heart rate noted  Respiratory: Normal respiratory effort, no problems with respiration noted  Abdomen: Soft, gravid, appropriate for gestational age.  Pain/Pressure: Present     Pelvic: Cervical exam performed in the presence of a chaperone Dilation: Closed Effacement (%): Thick Station: Ballotable  Extremities: Normal range of motion.  Edema: Trace  Mental Status: Normal mood and affect. Normal behavior. Normal judgment and thought content.   Assessment and Plan:  Pregnancy: G1P0000 at [redacted]w[redacted]d 1. Supervision of other normal pregnancy, antepartum (Primary) Patient is doing well without complaints Cultures today Will defer first pap smear until postpartum period  2. [redacted] weeks gestation of pregnancy   3. Fetal growth restriction antepartum Follow up growth 7/29 7/15 BPP 8/8 NST reviewed and appropriate with baseline 140, mod  variability, 10x10 accels, no decels Plan for IOL at 38 weeks per MFM note  4. Anemia of mother in pregnancy, antepartum, third trimester Continue iron supplement  Preterm labor symptoms and general obstetric precautions including but not limited to vaginal bleeding, contractions, leaking of fluid and fetal movement were reviewed in detail with the patient. Please refer to After Visit Summary for other counseling recommendations.   Return in about 1 week (around 11/19/2023) for in person, ROB, High risk, NST.  Future Appointments  Date Time Provider Department Center  11/17/2023  8:00 AM [facility] PROVIDER 1 [facility]  11/17/2023  8:30 AM [facility] US2 [facility]  11/25/2023  7:00 AM [facility] PROVIDER 1 [facility]  11/25/2023  7:30 AM [facility] US3 [facility]  11/26/2023  6:30 AM [facility]-LD SCHED ROOM [facility] None  12/02/2023  7:00 AM [facility] PROVIDER 1 [facility]  12/02/2023  7:30 AM [facility] US3 [facility]    Winton Felt, MD

## 2023-11-14 ENCOUNTER — Encounter: Payer: Self-pay | Admitting: Obstetrics and Gynecology

## 2023-11-16 LAB — CULTURE, BETA STREP (GROUP B ONLY): Strep Gp B Culture: NEGATIVE

## 2023-11-17 ENCOUNTER — Ambulatory Visit: Attending: Obstetrics and Gynecology | Admitting: Maternal & Fetal Medicine

## 2023-11-17 ENCOUNTER — Ambulatory Visit (HOSPITAL_BASED_OUTPATIENT_CLINIC_OR_DEPARTMENT_OTHER)

## 2023-11-17 VITALS — BP 127/80 | HR 95

## 2023-11-17 DIAGNOSIS — Z3480 Encounter for supervision of other normal pregnancy, unspecified trimester: Secondary | ICD-10-CM

## 2023-11-17 DIAGNOSIS — Z3A36 36 weeks gestation of pregnancy: Secondary | ICD-10-CM | POA: Diagnosis not present

## 2023-11-17 DIAGNOSIS — O26853 Spotting complicating pregnancy, third trimester: Secondary | ICD-10-CM

## 2023-11-17 DIAGNOSIS — O365990 Maternal care for other known or suspected poor fetal growth, unspecified trimester, not applicable or unspecified: Secondary | ICD-10-CM

## 2023-11-17 DIAGNOSIS — O99013 Anemia complicating pregnancy, third trimester: Secondary | ICD-10-CM | POA: Insufficient documentation

## 2023-11-17 DIAGNOSIS — O365930 Maternal care for other known or suspected poor fetal growth, third trimester, not applicable or unspecified: Secondary | ICD-10-CM

## 2023-11-17 NOTE — Progress Notes (Signed)
   Patient information  Patient Name: Tracy Nichols  Patient MRN:   [id number]  Referring practice: MFM Referring Provider: Anna SHIPPER  Problem List   Patient Active Problem List   Diagnosis Date Noted   Fetal growth restriction antepartum 11/12/2023   Anemia of mother in pregnancy, antepartum, third trimester 09/22/2023   Supervision of other normal pregnancy, antepartum 06/24/2023    Maternal Fetal medicine Consult  ALAHNI VARONE is a 23 y.o. G1P0000 at [redacted]w[redacted]d here for ultrasound and consultation. Akelia I Jentsch is doing well today with no acute concerns. Today we focused on the following:   Fetal growth restriction: The patient was here for biophysical profile and umbilical artery Dopplers which are both reassuring today.  She reports good fetal movement.  We discussed delivery timing will remain August 7 unless clinical picture changes.  Vaginal spotting: The patient reports mild vaginal spotting after wiping.  She has some mild cramping on occasion but nothing right now.  I instructed her to go to the MAU with any acute concerns or if the bleeding/cramping gets worse.  She also has a doctors appointment tomorrow and she will let her OB provider know if she has had some spotting.  The patient had time to ask questions that were answered to her satisfaction.  She verbalized understanding and agrees to proceed with the plan below.  Sonographic findings Single intrauterine pregnancy at 36w 5d. Fetal cardiac activity:  Observed and appears normal. Presentation: Cephalic. Interval fetal anatomy appears normal. Amniotic fluid: Subjectively upper-normal.  MVP: 7.91 cm. Placenta: Anterior. Umbilical artery dopplers findings: -S/D:2.05 which are normal at this gestational age.  -Absent end-diastolic flow: No.  -Reversed end-diastolic flow:  No. BPP 8/8.   There are limitations of prenatal ultrasound such as the inability to detect certain abnormalities due to poor visualization. Various  factors such as fetal position, gestational age and maternal body habitus may increase the difficulty in visualizing the fetal anatomy.    Recommendations Continue weekly umbilical artery Dopplers and antenatal testing as scheduled.  Delivery scheduled for 11/26/2023.  Review of Systems: A review of systems was performed and was negative except per HPI   Vitals and Physical Exam    11/17/2023    8:28 AM 11/12/2023    3:08 PM 11/05/2023    4:22 PM  Vitals with BMI  Weight  140 lbs 139 lbs 2 oz  BMI  26.47 26.3  Systolic 127 123 865  Diastolic 80 82 86  Pulse 95 99 101    Sitting comfortably on the sonogram table Nonlabored breathing Normal rate and rhythm Abdomen is nontender  Past pregnancies OB History  Gravida Para Term Preterm AB Living  1 0 0 0 0 0  SAB IAB Ectopic Multiple Live Births  0 0 0 0 0    # Outcome Date GA Lbr Len/2nd Weight Sex Type Anes PTL Lv  1 Current              I spent 20 minutes reviewing the patients chart, including labs and images as well as counseling the patient about her medical conditions. Greater than 50% of the time was spent in direct face-to-face patient counseling.  Delora Smaller  MFM, [facility]   11/17/2023  9:15 AM

## 2023-11-18 ENCOUNTER — Ambulatory Visit: Admitting: Obstetrics and Gynecology

## 2023-11-18 VITALS — BP 121/79 | HR 108 | Wt 150.0 lb

## 2023-11-18 DIAGNOSIS — O365990 Maternal care for other known or suspected poor fetal growth, unspecified trimester, not applicable or unspecified: Secondary | ICD-10-CM | POA: Diagnosis not present

## 2023-11-18 DIAGNOSIS — O0993 Supervision of high risk pregnancy, unspecified, third trimester: Secondary | ICD-10-CM

## 2023-11-18 DIAGNOSIS — Z3A36 36 weeks gestation of pregnancy: Secondary | ICD-10-CM | POA: Diagnosis not present

## 2023-11-18 DIAGNOSIS — Z3480 Encounter for supervision of other normal pregnancy, unspecified trimester: Secondary | ICD-10-CM

## 2023-11-18 NOTE — Progress Notes (Signed)
 Pt states she has been bleeding - pt states it has gotten heavier.  Bleeding started on Sunday.

## 2023-11-18 NOTE — Progress Notes (Signed)
   PRENATAL VISIT NOTE  Subjective:  Tracy Nichols is a 23 y.o. G1P0000 at [redacted]w[redacted]d being seen today for ongoing prenatal care.  She is currently monitored for the following issues for this high-risk pregnancy and has Supervision of other normal pregnancy, antepartum; Anemia of mother in pregnancy, antepartum, third trimester; and Fetal growth restriction antepartum on their problem list.  Patient doing well with no acute concerns today. She reports vaginal spotting/bleeding.  Contractions: Irregular. Vag. Bleeding: Small.  Movement: Present. Denies leaking of fluid.   The following portions of the patient's history were reviewed and updated as appropriate: allergies, current medications, past family history, past medical history, past social history, past surgical history and problem list. Problem list updated.  Objective:   Vitals:   11/18/23 1119  BP: 121/79  Pulse: (!) 108  Weight: 150 lb (68 kg)    Fetal Status: Fetal Heart Rate (bpm): 140   Movement: Present     General:  Alert, oriented and cooperative. Patient is in no acute distress.  Skin: Skin is warm and dry. No rash noted.   Cardiovascular: Normal heart rate noted  Respiratory: Normal respiratory effort, no problems with respiration noted  Abdomen: Soft, gravid, appropriate for gestational age.  Pain/Pressure: Present     Pelvic: Cervical exam deferred        Extremities: Normal range of motion.     Mental Status:  Normal mood and affect. Normal behavior. Normal judgment and thought content.  SSE: normal appearing vagina and cervix, no active bleeding or old blood noted in the vagina Assessment and Plan:  Pregnancy: G1P0000 at [redacted]w[redacted]d  1. [redacted] weeks gestation of pregnancy (Primary)   2. Fetal growth restriction antepartum EFW 8%, pt scheduled for IOL on 8/7  3. Supervision of other normal pregnancy, antepartum Continue routine prenatal care Weekly BPP/Dopplers, IOL pending  Preterm labor symptoms and general obstetric  precautions including but not limited to vaginal bleeding, contractions, leaking of fluid and fetal movement were reviewed in detail with the patient.  Please refer to After Visit Summary for other counseling recommendations.   Return in about 1 week (around 11/25/2023) for ROB, in person.   Jerilynn Buddle, MD Faculty Attending Center for [facility]

## 2023-11-19 ENCOUNTER — Telehealth (HOSPITAL_COMMUNITY): Payer: Self-pay | Admitting: *Deleted

## 2023-11-19 ENCOUNTER — Encounter (HOSPITAL_COMMUNITY): Payer: Self-pay | Admitting: *Deleted

## 2023-11-19 NOTE — Telephone Encounter (Signed)
 Preadmission screen

## 2023-11-24 ENCOUNTER — Ambulatory Visit: Admitting: Obstetrics and Gynecology

## 2023-11-24 VITALS — BP 123/83 | HR 94 | Wt 145.9 lb

## 2023-11-24 DIAGNOSIS — O365930 Maternal care for other known or suspected poor fetal growth, third trimester, not applicable or unspecified: Secondary | ICD-10-CM | POA: Diagnosis not present

## 2023-11-24 DIAGNOSIS — O99713 Diseases of the skin and subcutaneous tissue complicating pregnancy, third trimester: Secondary | ICD-10-CM

## 2023-11-24 DIAGNOSIS — Z3480 Encounter for supervision of other normal pregnancy, unspecified trimester: Secondary | ICD-10-CM

## 2023-11-24 DIAGNOSIS — O0993 Supervision of high risk pregnancy, unspecified, third trimester: Secondary | ICD-10-CM | POA: Diagnosis not present

## 2023-11-24 DIAGNOSIS — O365990 Maternal care for other known or suspected poor fetal growth, unspecified trimester, not applicable or unspecified: Secondary | ICD-10-CM | POA: Diagnosis not present

## 2023-11-24 DIAGNOSIS — L299 Pruritus, unspecified: Secondary | ICD-10-CM

## 2023-11-24 DIAGNOSIS — Z3A37 37 weeks gestation of pregnancy: Secondary | ICD-10-CM | POA: Diagnosis not present

## 2023-11-24 NOTE — Progress Notes (Signed)
   PRENATAL VISIT NOTE  Subjective:  Tracy Nichols is a 23 y.o. G1P0000 at [redacted]w[redacted]d being seen today for ongoing prenatal care.  She is currently monitored for the following issues for this high-risk pregnancy and has Supervision of other normal pregnancy, antepartum; Anemia of mother in pregnancy, antepartum, third trimester; and Fetal growth restriction antepartum on their problem list.  Patient doing well with no acute concerns today. She reports itching of her hands and feet for about a week.  Contractions: Irritability. Vag. Bleeding: None.  Movement: Present. Denies leaking of fluid.   The following portions of the patient's history were reviewed and updated as appropriate: allergies, current medications, past family history, past medical history, past social history, past surgical history and problem list. Problem list updated.  Objective:   Vitals:   11/24/23 0922  BP: 123/83  Pulse: 94  Weight: 145 lb 14.4 oz (66.2 kg)    Fetal Status: Fetal Heart Rate (bpm): 142 Fundal Height: 34 cm Movement: Present     General:  Alert, oriented and cooperative. Patient is in no acute distress.  Skin: Skin is warm and dry. No rash noted.   Cardiovascular: Normal heart rate noted  Respiratory: Normal respiratory effort, no problems with respiration noted  Abdomen: Soft, gravid, appropriate for gestational age.  Pain/Pressure: Present     Pelvic: Cervical exam deferred        Extremities: Normal range of motion.  Edema: Trace  Mental Status:  Normal mood and affect. Normal behavior. Normal judgment and thought content.   Assessment and Plan:  Pregnancy: G1P0000 at [redacted]w[redacted]d  1. [redacted] weeks gestation of pregnancy (Primary)   2. Fetal growth restriction antepartum Pt has dopplers and growth scan on 11/25/23 IOL on 9/7  3. Supervision of other normal pregnancy, antepartum Continue routine prenatal care  4. Pruritus of pregnancy in third trimester Concerning for cholestasis of preg, discussed with  MFM Booker Will get NST today and dopplers/BPP tomorrow, IOL as scheduled if testing is reassuring CMP today, hold bile acids as they will not be back by time of IOL  - Comprehensive metabolic panel with GFR - Fetal nonstress test  Term labor symptoms and general obstetric precautions including but not limited to vaginal bleeding, contractions, leaking of fluid and fetal movement were reviewed in detail with the patient.  Please refer to After Visit Summary for other counseling recommendations.   Return in about 1 week (around 12/01/2023) for ROB, in person.   Jerilynn Buddle, MD Faculty Attending Center for [facility]

## 2023-11-24 NOTE — Progress Notes (Signed)
 Pt presents for ROB. Requesting prescription for cream for hand and feet itching for ~1 week.

## 2023-11-25 ENCOUNTER — Other Ambulatory Visit: Payer: Self-pay | Admitting: Obstetrics

## 2023-11-25 ENCOUNTER — Ambulatory Visit (HOSPITAL_BASED_OUTPATIENT_CLINIC_OR_DEPARTMENT_OTHER): Admitting: Maternal & Fetal Medicine

## 2023-11-25 ENCOUNTER — Ambulatory Visit

## 2023-11-25 VITALS — BP 126/77 | HR 93

## 2023-11-25 VITALS — BP 118/64 | HR 77

## 2023-11-25 DIAGNOSIS — O365990 Maternal care for other known or suspected poor fetal growth, unspecified trimester, not applicable or unspecified: Secondary | ICD-10-CM

## 2023-11-25 DIAGNOSIS — Z3A26 26 weeks gestation of pregnancy: Secondary | ICD-10-CM

## 2023-11-25 DIAGNOSIS — Z3A37 37 weeks gestation of pregnancy: Secondary | ICD-10-CM

## 2023-11-25 DIAGNOSIS — O365930 Maternal care for other known or suspected poor fetal growth, third trimester, not applicable or unspecified: Secondary | ICD-10-CM | POA: Insufficient documentation

## 2023-11-25 DIAGNOSIS — O99013 Anemia complicating pregnancy, third trimester: Secondary | ICD-10-CM

## 2023-11-25 DIAGNOSIS — Z3480 Encounter for supervision of other normal pregnancy, unspecified trimester: Secondary | ICD-10-CM

## 2023-11-25 NOTE — Procedures (Signed)
 Tracy Nichols 2001/02/27 [redacted]w[redacted]d  Fetus A Non-Stress Test Interpretation for 11/25/23-Nst with Bpp  Indication: IUGR, 6/8 BPP  Fetal Heart Rate A Mode: External Baseline Rate (A): 130 bpm Variability: Moderate Accelerations: 15 x 15 Decelerations: None Multiple birth?: No  Uterine Activity Mode: Toco Contraction Frequency (min): 2-3 Contraction Duration (sec): 35-65 Contraction Quality: Mild Resting Tone Palpated: Relaxed Resting Time: Adequate  Interpretation (Fetal Testing) Nonstress Test Interpretation: Reactive Comments: Tracing reivewed byDr. William

## 2023-11-25 NOTE — Progress Notes (Signed)
 After review, MFM consult with provider is not indicated for today  William Glenn, DO 11/25/2023 8:57 AM  [facility]

## 2023-11-26 ENCOUNTER — Inpatient Hospital Stay (HOSPITAL_COMMUNITY)

## 2023-11-27 ENCOUNTER — Inpatient Hospital Stay (HOSPITAL_COMMUNITY)
Admission: AD | Admit: 2023-11-27 | Discharge: 2023-11-30 | DRG: 807 | Disposition: A | Discharge status: Home / Self Care | Attending: Obstetrics & Gynecology | Admitting: Obstetrics & Gynecology

## 2023-11-27 ENCOUNTER — Encounter (HOSPITAL_COMMUNITY): Payer: Self-pay | Admitting: Obstetrics and Gynecology

## 2023-11-27 DIAGNOSIS — O365990 Maternal care for other known or suspected poor fetal growth, unspecified trimester, not applicable or unspecified: Secondary | ICD-10-CM | POA: Diagnosis present

## 2023-11-27 DIAGNOSIS — O134 Gestational [pregnancy-induced] hypertension without significant proteinuria, complicating childbirth: Secondary | ICD-10-CM | POA: Diagnosis present

## 2023-11-27 DIAGNOSIS — Z7982 Long term (current) use of aspirin: Secondary | ICD-10-CM | POA: Diagnosis not present

## 2023-11-27 DIAGNOSIS — O139 Gestational [pregnancy-induced] hypertension without significant proteinuria, unspecified trimester: Secondary | ICD-10-CM | POA: Diagnosis not present

## 2023-11-27 DIAGNOSIS — O9902 Anemia complicating childbirth: Secondary | ICD-10-CM | POA: Diagnosis present

## 2023-11-27 DIAGNOSIS — Z3490 Encounter for supervision of normal pregnancy, unspecified, unspecified trimester: Secondary | ICD-10-CM

## 2023-11-27 DIAGNOSIS — O365930 Maternal care for other known or suspected poor fetal growth, third trimester, not applicable or unspecified: Principal | ICD-10-CM | POA: Diagnosis present

## 2023-11-27 DIAGNOSIS — Z3A38 38 weeks gestation of pregnancy: Secondary | ICD-10-CM | POA: Diagnosis not present

## 2023-11-27 DIAGNOSIS — O99013 Anemia complicating pregnancy, third trimester: Principal | ICD-10-CM | POA: Diagnosis present

## 2023-11-27 DIAGNOSIS — Z3480 Encounter for supervision of other normal pregnancy, unspecified trimester: Secondary | ICD-10-CM

## 2023-11-27 LAB — CBC
HCT: 33.4 % — ABNORMAL LOW (ref 36.0–46.0)
Hemoglobin: 10.7 g/dL — ABNORMAL LOW (ref 12.0–15.0)
MCH: 27 pg (ref 26.0–34.0)
MCHC: 32 g/dL (ref 30.0–36.0)
MCV: 84.3 fL (ref 80.0–100.0)
Platelets: 199 K/uL (ref 150–400)
RBC: 3.96 MIL/uL (ref 3.87–5.11)
RDW: 14.8 % (ref 11.5–15.5)
WBC: 9.5 K/uL (ref 4.0–10.5)
nRBC: 0 % (ref 0.0–0.2)

## 2023-11-27 LAB — COMPREHENSIVE METABOLIC PANEL WITH GFR
ALT: 13 U/L (ref 0–44)
AST: 20 U/L (ref 15–41)
Albumin: 2.8 g/dL — ABNORMAL LOW (ref 3.5–5.0)
Alkaline Phosphatase: 213 U/L — ABNORMAL HIGH (ref 38–126)
Anion gap: 12 (ref 5–15)
BUN: 7 mg/dL (ref 6–20)
CO2: 16 mmol/L — ABNORMAL LOW (ref 22–32)
Calcium: 9.2 mg/dL (ref 8.9–10.3)
Chloride: 108 mmol/L (ref 98–111)
Creatinine, Ser: 0.64 mg/dL (ref 0.44–1.00)
GFR, Estimated: >60 mL/min (ref >60)
Glucose, Bld: 128 mg/dL — ABNORMAL HIGH (ref 70–99)
Potassium: 3.5 mmol/L (ref 3.5–5.1)
Sodium: 136 mmol/L (ref 135–145)
Total Bilirubin: 0.6 mg/dL (ref 0.0–1.2)
Total Protein: 6.5 g/dL (ref 6.5–8.1)

## 2023-11-27 LAB — TYPE AND SCREEN
ABO/RH(D): O POS
Antibody Screen: NEGATIVE

## 2023-11-27 MED ORDER — MISOPROSTOL 25 MCG QUARTER TABLET
25.0000 ug | ORAL_TABLET | ORAL | Status: DC | PRN
  Administered 2023-11-27: 25 ug via VAGINAL
  Filled 2023-11-27: qty 1

## 2023-11-27 MED ORDER — OXYTOCIN BOLUS FROM INFUSION: 333.0000 mL | Freq: Once | INTRAVENOUS | Status: DC

## 2023-11-27 MED ORDER — ACETAMINOPHEN 325 MG PO TABS: 650.0000 mg | ORAL_TABLET | ORAL | Status: DC | PRN

## 2023-11-27 MED ORDER — OXYTOCIN-SODIUM CHLORIDE 30-0.9 UT/500ML-% IV SOLN
2.5000 [IU]/h | INTRAVENOUS | Status: DC
  Filled 2023-11-27: qty 500

## 2023-11-27 MED ORDER — TERBUTALINE SULFATE 1 MG/ML IJ SOLN: 0.2500 mg | Freq: Once | INTRAMUSCULAR | Status: DC | PRN

## 2023-11-27 MED ORDER — SOD CITRATE-CITRIC ACID 500-334 MG/5ML PO SOLN: 30.0000 mL | ORAL | Status: DC | PRN

## 2023-11-27 MED ORDER — ONDANSETRON HCL 4 MG/2ML IJ SOLN
4.0000 mg | Freq: Four times a day (QID) | INTRAMUSCULAR | Status: DC | PRN
  Administered 2023-11-27: 4 mg via INTRAVENOUS
  Filled 2023-11-27: qty 2

## 2023-11-27 MED ORDER — LACTATED RINGERS IV SOLN: 500.0000 mL | INTRAVENOUS | Status: DC | PRN

## 2023-11-27 MED ORDER — OXYCODONE-ACETAMINOPHEN 5-325 MG PO TABS: 1.0000 | ORAL_TABLET | ORAL | Status: DC | PRN

## 2023-11-27 MED ORDER — LACTATED RINGERS IV SOLN: INTRAVENOUS | Status: DC

## 2023-11-27 MED ORDER — OXYCODONE-ACETAMINOPHEN 5-325 MG PO TABS: 2.0000 | ORAL_TABLET | ORAL | Status: DC | PRN

## 2023-11-27 MED ORDER — TRANEXAMIC ACID-NACL 1000-0.7 MG/100ML-% IV SOLN: 1000.0000 mg | INTRAVENOUS | Status: DC

## 2023-11-27 MED ORDER — FENTANYL CITRATE (PF) 100 MCG/2ML IJ SOLN
50.0000 ug | INTRAMUSCULAR | Status: DC | PRN
  Administered 2023-11-27: 100 ug via INTRAVENOUS
  Filled 2023-11-27: qty 2

## 2023-11-27 MED ORDER — LIDOCAINE HCL (PF) 1 % IJ SOLN: 30.0000 mL | INTRAMUSCULAR | Status: DC | PRN

## 2023-11-27 NOTE — H&P (Signed)
 OBSTETRIC ADMISSION HISTORY AND PHYSICAL  Tracy Nichols is a 23 y.o. female G1P0000 with IUP at [redacted]w[redacted]d by LMP presenting for IOL for IUGR 16%tile with BPP 6/8 today. She reports +FMs, No LOF, no VB, no blurry vision, headaches or peripheral edema, and RUQ pain.  She plans on breast feeding. She request Nuvaring for birth control. She received her prenatal care at [facility]   Dating: By LMP --->  Estimated Date of Delivery: 12/10/23  Sono:   @[redacted]w[redacted]d , CWD, normal anatomy, cephalic presentation, anterior lie, 2806 gm 6 lb 3 oz 16 % EFW   Prenatal History/Complications:  - IUGR 16%tile with BPP 6/8  - Anemia HB 8.9 - Elevated BP w/o diagnosis of hypertension  Past Medical History: Past Medical History:  Diagnosis Date   Anemia    Medical history non-contributory     Past Surgical History: Past Surgical History:  Procedure Laterality Date   NO PAST SURGERIES      Obstetrical History: OB History     Gravida  1   Para  0   Term  0   Preterm  0   AB  0   Living  0      SAB  0   IAB  0   Ectopic  0   Multiple  0   Live Births  0           Social History Social History   Socioeconomic History   Marital status: Single    Spouse name: Not on file   Number of children: Not on file   Years of education: Not on file   Highest education level: Not on file  Occupational History   Not on file  Tobacco Use   Smoking status: Never   Smokeless tobacco: Never  Vaping Use   Vaping status: Never Used  Substance and Sexual Activity   Alcohol use: Not Currently   Drug use: Not Currently    Types: Marijuana   Sexual activity: Yes  Other Topics Concern   Not on file  Social History Narrative   Not on file   Social Drivers of Health   Financial Resource Strain: Not on file  Food Insecurity: No Food Insecurity (10/28/2023)   Hunger Vital Sign    Worried About Running Out of Food in the Last Year: Never true    Ran Out of Food in the Last Year: Never true   Transportation Needs: No Transportation Needs (10/28/2023)   PRAPARE - Administrator, Civil Service (Medical): No    Lack of Transportation (Non-Medical): No  Physical Activity: Not on file  Stress: Not on file  Social Connections: Not on file    Family History: No family history on file.  Allergies: No Known Allergies  Medications Prior to Admission  Medication Sig Dispense Refill Last Dose/Taking   aspirin EC 81 MG tablet Take 81 mg by mouth daily. Swallow whole.      Ferric Maltol  (ACCRUFER ) 30 MG CAPS Take 1 capsule (30 mg total) by mouth daily with breakfast. 30 capsule 3    Prenatal Vit-Fe Fumarate-FA (PRENATAL MULTIVITAMIN) TABS tablet Take 1 tablet by mouth daily at 12 noon.        Review of Systems   All systems reviewed and negative except as stated in HPI  Last menstrual period 03/05/2023. General appearance: alert, cooperative, appears stated age, and no distress Lungs: clear to auscultation bilaterally Heart: regular rate and rhythm Abdomen: soft, non-tender; bowel sounds normal  Pelvic: adequate, unproven Extremities: Homans sign is negative, no sign of DVT DTR's 2+ Presentation: cephalic Fetal monitoringBaseline: 130 bpm, Variability: Good {> 6 bpm), Accelerations: Reactive, and Decelerations: Absent Uterine activityFrequency: Every 2-3 minutes     Prenatal labs: ABO, Rh: O/Positive/-- (03/13 0903) Antibody: Negative (03/13 0903) Rubella: 1.07 (03/13 0903) RPR: Non Reactive (06/02 0812)  HBsAg: Negative (03/13 0903)  HIV: Non Reactive (06/02 0812)  GBS: Negative/-- (07/24 1555)    Lab Results  Component Value Date   GBS Negative 11/12/2023   GTT wnl Genetic screening  negative, low risk Anatomy US  normal female   There is no immunization history on file for this patient.  Prenatal Transfer Tool  Maternal Diabetes: No Genetic Screening: Normal Maternal Ultrasounds/Referrals: IUGR Fetal Ultrasounds or other Referrals:   None Maternal Substance Abuse:  No Significant Maternal Medications:  None Significant Maternal Lab Results: Group B Strep negative Number of Prenatal Visits:greater than 3 verified prenatal visits Maternal Vaccinations:Declined Other Comments:  None   No results found for this or any previous visit (from the past 24 hours).  Patient Active Problem List   Diagnosis Date Noted   Fetal growth restriction antepartum 11/12/2023   Anemia of mother in pregnancy, antepartum, third trimester 09/22/2023   Supervision of other normal pregnancy, antepartum 06/24/2023    Assessment/Plan:  KITARA HEBB is a 23 y.o. G1P0000 at [redacted]w[redacted]d here for IUGR 16%tile with BPP 6/8  #Labor:Vaginal cytotec  > FB > AROM > Pitocin  as direced by SVE  #Pain: Per pt request #FWB: Cat I #GBS status:  negative #Feeding: Breastmilk  #Reproductive Life planning: NuvaRing #Circ:  not applicable  Mardy Shropshire, MD  11/27/2023, 1:06 PM

## 2023-11-28 ENCOUNTER — Other Ambulatory Visit: Payer: Self-pay

## 2023-11-28 ENCOUNTER — Encounter (HOSPITAL_COMMUNITY): Payer: Self-pay | Admitting: Obstetrics and Gynecology

## 2023-11-28 ENCOUNTER — Inpatient Hospital Stay (HOSPITAL_COMMUNITY): Admitting: Anesthesiology

## 2023-11-28 DIAGNOSIS — Z3A38 38 weeks gestation of pregnancy: Secondary | ICD-10-CM

## 2023-11-28 DIAGNOSIS — O134 Gestational [pregnancy-induced] hypertension without significant proteinuria, complicating childbirth: Secondary | ICD-10-CM

## 2023-11-28 DIAGNOSIS — O365930 Maternal care for other known or suspected poor fetal growth, third trimester, not applicable or unspecified: Secondary | ICD-10-CM

## 2023-11-28 LAB — PROTEIN / CREATININE RATIO, URINE
Creatinine, Urine: 34 mg/dL
Protein Creatinine Ratio: 0.26 mg/mg{creat} — ABNORMAL HIGH (ref 0.00–0.15)
Total Protein, Urine: 9 mg/dL

## 2023-11-28 LAB — RPR: RPR Ser Ql: NONREACTIVE

## 2023-11-28 MED ORDER — OXYTOCIN-SODIUM CHLORIDE 30-0.9 UT/500ML-% IV SOLN
1.0000 m[IU]/min | INTRAVENOUS | Status: DC
  Administered 2023-11-28: 2 m[IU]/min via INTRAVENOUS

## 2023-11-28 MED ORDER — PHENYLEPHRINE 80 MCG/ML (10ML) SYRINGE FOR IV PUSH (FOR BLOOD PRESSURE SUPPORT): 80.0000 ug | PREFILLED_SYRINGE | INTRAVENOUS | Status: DC | PRN

## 2023-11-28 MED ORDER — SODIUM CHLORIDE 0.9 % IV SOLN: 250.0000 mL | INTRAVENOUS | Status: DC | PRN

## 2023-11-28 MED ORDER — LACTATED RINGERS IV SOLN
500.0000 mL | Freq: Once | INTRAVENOUS | Status: AC
  Administered 2023-11-28: 500 mL via INTRAVENOUS

## 2023-11-28 MED ORDER — EPHEDRINE 5 MG/ML INJ: 10.0000 mg | INTRAVENOUS | Status: DC | PRN

## 2023-11-28 MED ORDER — SIMETHICONE 80 MG PO CHEW: 80.0000 mg | CHEWABLE_TABLET | ORAL | Status: DC | PRN

## 2023-11-28 MED ORDER — LIDOCAINE HCL (PF) 1 % IJ SOLN
INTRAMUSCULAR | Status: DC | PRN
  Administered 2023-11-28: 10 mL via EPIDURAL

## 2023-11-28 MED ORDER — SODIUM CHLORIDE 0.9% FLUSH
3.0000 mL | INTRAVENOUS | Status: DC | PRN
Start: 2023-11-28 — End: 2023-11-30

## 2023-11-28 MED ORDER — PRENATAL MULTIVITAMIN CH
1.0000 | ORAL_TABLET | Freq: Every day | ORAL | Status: DC
  Administered 2023-11-28: 1 via ORAL
  Filled 2023-11-28 (×2): qty 1

## 2023-11-28 MED ORDER — DIPHENHYDRAMINE HCL 50 MG/ML IJ SOLN: 12.5000 mg | INTRAMUSCULAR | Status: DC | PRN

## 2023-11-28 MED ORDER — DIPHENHYDRAMINE HCL 25 MG PO CAPS: 25.0000 mg | ORAL_CAPSULE | Freq: Four times a day (QID) | ORAL | Status: DC | PRN

## 2023-11-28 MED ORDER — BENZOCAINE-MENTHOL 20-0.5 % EX AERO
1.0000 | INHALATION_SPRAY | CUTANEOUS | Status: DC | PRN
  Administered 2023-11-28: 1 via TOPICAL
  Filled 2023-11-28: qty 56

## 2023-11-28 MED ORDER — IBUPROFEN 800 MG PO TABS
800.0000 mg | ORAL_TABLET | Freq: Three times a day (TID) | ORAL | Status: DC
  Administered 2023-11-28 – 2023-11-30 (×6): 800 mg via ORAL
  Filled 2023-11-28 (×6): qty 1

## 2023-11-28 MED ORDER — DIBUCAINE (PERIANAL) 1 % EX OINT: 1.0000 | TOPICAL_OINTMENT | CUTANEOUS | Status: DC | PRN

## 2023-11-28 MED ORDER — TETANUS-DIPHTH-ACELL PERTUSSIS 5-2.5-18.5 LF-MCG/0.5 IM SUSY
0.5000 mL | PREFILLED_SYRINGE | Freq: Once | INTRAMUSCULAR | Status: AC
  Administered 2023-11-29: 0.5 mL via INTRAMUSCULAR
  Filled 2023-11-28: qty 0.5

## 2023-11-28 MED ORDER — FENTANYL-BUPIVACAINE-NACL 0.5-0.125-0.9 MG/250ML-% EP SOLN
12.0000 mL/h | EPIDURAL | Status: DC | PRN
  Administered 2023-11-28: 12 mL/h via EPIDURAL
  Filled 2023-11-28: qty 250

## 2023-11-28 MED ORDER — WITCH HAZEL-GLYCERIN EX PADS
1.0000 | MEDICATED_PAD | CUTANEOUS | Status: DC | PRN
Start: 2023-11-28 — End: 2023-11-30

## 2023-11-28 MED ORDER — ACETAMINOPHEN 325 MG PO TABS
650.0000 mg | ORAL_TABLET | ORAL | Status: DC | PRN
  Administered 2023-11-29: 650 mg via ORAL
  Filled 2023-11-28: qty 2

## 2023-11-28 MED ORDER — SENNOSIDES-DOCUSATE SODIUM 8.6-50 MG PO TABS
2.0000 | ORAL_TABLET | ORAL | Status: DC
  Administered 2023-11-28: 2 via ORAL
  Filled 2023-11-28 (×2): qty 2

## 2023-11-28 MED ORDER — COCONUT OIL OIL: 1.0000 | TOPICAL_OIL | Status: DC | PRN

## 2023-11-28 MED ORDER — ONDANSETRON HCL 4 MG PO TABS: 4.0000 mg | ORAL_TABLET | ORAL | Status: DC | PRN

## 2023-11-28 MED ORDER — ONDANSETRON HCL 4 MG/2ML IJ SOLN: 4.0000 mg | INTRAMUSCULAR | Status: DC | PRN

## 2023-11-28 MED ORDER — SODIUM CHLORIDE 0.9% FLUSH
3.0000 mL | Freq: Two times a day (BID) | INTRAVENOUS | Status: DC
  Administered 2023-11-28: 3 mL via INTRAVENOUS

## 2023-11-28 NOTE — Anesthesia Procedure Notes (Addendum)
 Epidural Patient location during procedure: OB Start time: 11/28/2023 1:30 AM End time: 11/28/2023 1:40 AM  Staffing Anesthesiologist: Niels Marien CROME, MD Performed: anesthesiologist   Preanesthetic Checklist Completed: patient identified, IV checked, risks and benefits discussed, monitors and equipment checked, pre-op evaluation and timeout performed  Epidural Patient position: sitting Prep: DuraPrep and site prepped and draped Patient monitoring: continuous pulse ox, blood pressure, heart rate and cardiac monitor Approach: midline Location: L3-L4 Injection technique: LOR air  Needle:  Needle type: Tuohy  Needle gauge: 17 G Needle length: 9 cm Needle insertion depth: 5 cm Catheter type: closed end flexible Catheter size: 19 Gauge Catheter at skin depth: 10 cm Test dose: negative  Assessment Sensory level: T8 Events: blood not aspirated, no cerebrospinal fluid, injection not painful, no injection resistance, no paresthesia and negative IV test  Additional Notes Patient identified. Risks/Benefits/Options discussed with patient including but not limited to bleeding, infection, nerve damage, paralysis, failed block, incomplete pain control, headache, blood pressure changes, nausea, vomiting, reactions to medication both or allergic, itching and postpartum back pain. Confirmed with bedside nurse the patient's most recent platelet count. Confirmed with patient that they are not currently taking any anticoagulation, have any bleeding history or any family history of bleeding disorders. Patient expressed understanding and wished to proceed. All questions were answered. Sterile technique was used throughout the entire procedure. Please see nursing notes for vital signs. Test dose was given through epidural catheter and negative prior to continuing to dose epidural or start infusion. Warning signs of high block given to the patient including shortness of breath, tingling/numbness in hands,  complete motor block, or any concerning symptoms with instructions to call for help. Patient was given instructions on fall risk and not to get out of bed. All questions and concerns addressed with instructions to call with any issues or inadequate analgesia.  Reason for block:procedure for pain

## 2023-11-28 NOTE — Lactation Note (Signed)
 This note was copied from a baby's chart. Lactation Consultation Note  Patient Name: Tracy Nichols Date: 11/28/2023 Age:22 hours Reason for consult: Initial assessment;1st time breastfeeding;Early term 37-38.6wks;Infant < 6lbs, see MOB: MR hx of anemia.  CO: infant is less than 6 lbs, not latching well at the breast currently  with uncoordinated suck, tendency to suck on  her tongue, ETI  MOB attempted to latch infant on her right breast using the football hold position, with pillow support, infant latch but only held nipple in her mouth. Mostly licked and tasted. Immediately afterwards infant was supplemented with 22 kcal formula using extra slow flow bottle nipple by MGM while MOB used the DEBP. Infant has uncoordinated suck with bottle nipple MGM is pace feeding infant, infant would benefit from SLP consult if feeding does not improved, LC informed RN of infant's current feeding behaviors. MOB was taught hand expression and infant was given 2 mls of colostrum by spoon.  When discussing supplementation MOB declined donor milk and prefers to use formula .LC discussed LBW/LPTI feeding guidelines with MOB. MOB was  made aware of O/P services, breastfeeding support groups, community resources, and our phone # for post-discharge questions.    LC discussed MOB EBM is safe for 4 hours whereas formula RTF once open is only safe for 1 hour.   Day 1 Following LBW/ LPTI Feeding Guidelines 1- MOB will pre-pump prior to latch infant and  will continue to latch infant first every feeding. MOB will  limit breast and bottle feeding every 3 hours and total feedings to 30 minutes or less. 2- After latching infant at the breast, MOB will immediately offer infant any EBM first that is pumped and then 22 kcal formula. 3- MOB knows to continue to ask for latch assistance if needed. 4- MOB will continue to use DEBP every 3 hours for 15 minutes on initial setting.   Maternal Data Has patient been taught Hand  Expression?: Yes Does the patient have breastfeeding experience prior to this delivery?: No  Feeding Mother's Current Feeding Choice: Breast Milk and Formula  LATCH Score Latch: Too sleepy or reluctant, no latch achieved, no sucking elicited.  Audible Swallowing: None  Type of Nipple: Flat  Comfort (Breast/Nipple): Soft / non-tender  Hold (Positioning): Assistance needed to correctly position infant at breast and maintain latch.  LATCH Score: 4   Lactation Tools Discussed/Used Tools: Pump;Flanges Flange Size: 18 (MOB probably could use 16 mm which LC services does not stock.) Breast pump type: Double-Electric Breast Pump Pump Education: Setup, frequency, and cleaning;Milk Storage Reason for Pumping: Infant is not latching well, less than 6 lbs and ETI Pumping frequency: MOB will continue to use the DEBP every 3 hours for 15 minutes  Interventions Interventions: Breast feeding basics reviewed;Assisted with latch;Skin to skin;Breast compression;Adjust position;Support pillows;Position options;Expressed milk;Hand express;Pre-pump if needed;Education;Pace feeding;DEBP;Hand pump;Guidelines for Milk Supply and Pumping Schedule Handout;LC Services brochure;LPT handout/interventions;CDC milk storage guidelines;CDC Guidelines for Breast Pump Cleaning  Discharge Pump: DEBP;Personal (MOB has ordered her DEBP with insurance)  Consult Status Consult Status: Follow-up Date: 11/29/23 Follow-up type: In-patient    Tracy Nichols 11/28/2023, 4:56 PM

## 2023-11-28 NOTE — Progress Notes (Signed)
 Labor Progress Note SHAKETA SERAFIN is a 23 y.o. G1P0000 at [redacted]w[redacted]d presented for IOL for BPP 6/8  S: Glena is sitting up in bed and comfortable now that her balloon has come out. She is amenable to cervical exam and AROM. Risks and benefits of AROM discussed.   O:  BP (!) 151/95   Pulse (!) 106   Temp 98.2 F (36.8 C) (Oral)   Resp 16   LMP 03/05/2023  EFM: 130 bpm/moderate variability/accelerations present, no decelerations  Toco: q 2-4 min  CVE: Dilation: 3.5 Effacement (%): 50 Station: -2 Presentation: Vertex Exam by:: Dr. Janita   A&P: 23 y.o. G1P0000 [redacted]w[redacted]d here for IOL for BPP 6/8 #Labor: Progressing well. AROM done at this check  #Pain: Per patient request #FWB: Category I strip #GBS negative   Vernell DELENA Janita, MD 12:36 AM

## 2023-11-28 NOTE — Anesthesia Preprocedure Evaluation (Addendum)
 Anesthesia Evaluation  Patient identified by MRN, date of birth, ID band Patient awake    Reviewed: Allergy & Precautions, NPO status , Patient's Chart, lab work & pertinent test results  Airway Mallampati: II  TM Distance: >3 FB Neck ROM: Full    Dental no notable dental hx.    Pulmonary neg pulmonary ROS   Pulmonary exam normal breath sounds clear to auscultation       Cardiovascular negative cardio ROS Normal cardiovascular exam Rhythm:Regular Rate:Normal     Neuro/Psych negative neurological ROS  negative psych ROS   GI/Hepatic negative GI ROS, Neg liver ROS,,,  Endo/Other  negative endocrine ROS    Renal/GU negative Renal ROS  negative genitourinary   Musculoskeletal negative musculoskeletal ROS (+)    Abdominal   Peds  Hematology  (+) Blood dyscrasia, anemia   Anesthesia Other Findings IOL for IUGR  Reproductive/Obstetrics (+) Pregnancy                              Anesthesia Physical Anesthesia Plan  ASA: 2  Anesthesia Plan: Epidural   Post-op Pain Management:    Induction:   PONV Risk Score and Plan: Treatment may vary due to age or medical condition  Airway Management Planned: Natural Airway  Additional Equipment:   Intra-op Plan:   Post-operative Plan:   Informed Consent: I have reviewed the patients History and Physical, chart, labs and discussed the procedure including the risks, benefits and alternatives for the proposed anesthesia with the patient or authorized representative who has indicated his/her understanding and acceptance.       Plan Discussed with: Anesthesiologist  Anesthesia Plan Comments: (Patient identified. Risks, benefits, options discussed with patient including but not limited to bleeding, infection, nerve damage, paralysis, failed block, incomplete pain control, headache, blood pressure changes, nausea, vomiting, reactions to medication,  itching, and post partum back pain. Confirmed with bedside nurse the patient's most recent platelet count. Confirmed with the patient that they are not taking any anticoagulation, have any bleeding history or any family history of bleeding disorders. Patient expressed understanding and wishes to proceed. All questions were answered. )         Anesthesia Quick Evaluation

## 2023-11-28 NOTE — Plan of Care (Signed)

## 2023-11-29 DIAGNOSIS — O139 Gestational [pregnancy-induced] hypertension without significant proteinuria, unspecified trimester: Secondary | ICD-10-CM | POA: Diagnosis not present

## 2023-11-29 LAB — BIRTH TISSUE RECOVERY COLLECTION (PLACENTA DONATION)

## 2023-11-29 MED ORDER — POTASSIUM CHLORIDE CRYS ER 20 MEQ PO TBCR
20.0000 meq | EXTENDED_RELEASE_TABLET | Freq: Every day | ORAL | Status: DC
  Administered 2023-11-29 – 2023-11-30 (×3): 20 meq via ORAL
  Filled 2023-11-29 (×2): qty 1

## 2023-11-29 MED ORDER — FUROSEMIDE 20 MG PO TABS
20.0000 mg | ORAL_TABLET | Freq: Every day | ORAL | Status: DC
  Administered 2023-11-29 – 2023-11-30 (×3): 20 mg via ORAL
  Filled 2023-11-29 (×2): qty 1

## 2023-11-29 NOTE — Progress Notes (Addendum)
 POSTPARTUM PROGRESS NOTE  Post Partum Day 1  Subjective:  Tracy Nichols is a 23 y.o. G1P1001 s/p SVD at [redacted]w[redacted]d.  She reports she is doing well. No acute events overnight. She denies any problems with ambulating, voiding or po intake. Denies nausea or vomiting.  Pain is well controlled.  Lochia is similar to a period.  Objective: Blood pressure 111/65, pulse 96, temperature 98 F (36.7 C), temperature source Oral, resp. rate 16, last menstrual period 03/05/2023, SpO2 100%, unknown if currently breastfeeding.  Physical Exam:  General: alert, cooperative and no distress Chest: no respiratory distress Heart:regular rate, distal pulses intact Uterine Fundus: firm, appropriately tender DVT Evaluation: No calf swelling or tenderness Extremities: No edema Skin: warm, dry  Recent Labs    11/27/23 1312  HGB 10.7*  HCT 33.4*    Assessment/Plan: Tracy Nichols is a 23 y.o. G1P1001 s/p SVD at [redacted]w[redacted]d   PPD#1 - Doing well  Routine postpartum care #gHTN Starting lasix  and KCl today.  - Monitor BP   Contraception: Nuva-Ring (need to discuss estrogen-containing contraception) Feeding: Bottle but may start breast Dispo: Plan for discharge tomorrow.   LOS: 2 days   Vernell DELENA School, MD FM Resident  11/29/2023, 7:39 AM

## 2023-11-29 NOTE — Anesthesia Postprocedure Evaluation (Signed)
 Anesthesia Post Note  Patient: Tracy Nichols  Procedure(s) Performed: AN AD HOC LABOR EPIDURAL     Patient location during evaluation: Mother Baby Anesthesia Type: Epidural Level of consciousness: awake Pain management: satisfactory to patient Vital Signs Assessment: post-procedure vital signs reviewed and stable Respiratory status: spontaneous breathing Cardiovascular status: stable Anesthetic complications: no   No notable events documented.  Last Vitals:  Vitals:   11/29/23 0118 11/29/23 0641  BP: 123/76 111/65  Pulse: 100 96  Resp: 18 16  Temp: 36.6 C 36.7 C  SpO2: 100% 100%    Last Pain:  Vitals:   11/29/23 0641  TempSrc: Oral  PainSc:    Pain Goal: Patients Stated Pain Goal: 0 (11/28/23 1452)                 JEANENNE HANDING

## 2023-11-29 NOTE — Plan of Care (Signed)
  Problem: Health Behavior/Discharge Planning: Goal: Ability to manage health-related needs will improve Outcome: Progressing   Problem: Clinical Measurements: Goal: Ability to maintain clinical measurements within normal limits will improve Outcome: Progressing Goal: Will remain free from infection Outcome: Progressing Goal: Respiratory complications will improve Outcome: Progressing Goal: Cardiovascular complication will be avoided Outcome: Progressing   Problem: Coping: Goal: Level of anxiety will decrease Outcome: Progressing   Problem: Elimination: Goal: Will not experience complications related to bowel motility Outcome: Progressing   Problem: Pain Managment: Goal: General experience of comfort will improve and/or be controlled Outcome: Progressing   Problem: Safety: Goal: Ability to remain free from injury will improve Outcome: Progressing   Problem: Skin Integrity: Goal: Risk for impaired skin integrity will decrease Outcome: Progressing   Problem: Education: Goal: Knowledge of Childbirth will improve Outcome: Progressing Goal: Ability to make informed decisions regarding treatment and plan of care will improve Outcome: Progressing Goal: Ability to state and carry out methods to decrease the pain will improve Outcome: Progressing Goal: Individualized Educational Video(s) Outcome: Progressing   Problem: Coping: Goal: Ability to verbalize concerns and feelings about labor and delivery will improve Outcome: Progressing   Problem: Life Cycle: Goal: Ability to make normal progression through stages of labor will improve Outcome: Progressing Goal: Ability to effectively push during vaginal delivery will improve Outcome: Progressing   Problem: Role Relationship: Goal: Will demonstrate positive interactions with the child Outcome: Progressing   Problem: Safety: Goal: Risk of complications during labor and delivery will decrease Outcome: Progressing    Problem: Pain Management: Goal: Relief or control of pain from uterine contractions will improve Outcome: Progressing   Problem: Education: Goal: Knowledge of condition will improve Outcome: Progressing Goal: Individualized Educational Video(s) Outcome: Progressing Goal: Individualized Newborn Educational Video(s) Outcome: Progressing   Problem: Coping: Goal: Ability to identify and utilize available resources and services will improve Outcome: Progressing   Problem: Life Cycle: Goal: Chance of risk for complications during the postpartum period will decrease Outcome: Progressing   Problem: Role Relationship: Goal: Ability to demonstrate positive interaction with newborn will improve Outcome: Progressing   Problem: Skin Integrity: Goal: Demonstration of wound healing without infection will improve Outcome: Progressing

## 2023-11-30 ENCOUNTER — Encounter: Payer: Self-pay | Admitting: Obstetrics and Gynecology

## 2023-11-30 ENCOUNTER — Other Ambulatory Visit (HOSPITAL_COMMUNITY): Payer: Self-pay

## 2023-11-30 MED ORDER — SENNA 8.6 MG PO TABS
2.0000 | ORAL_TABLET | Freq: Every evening | ORAL | 0 refills | Status: DC | PRN
  Filled 2023-11-30: qty 60, 30d supply, fill #0

## 2023-11-30 MED ORDER — FUROSEMIDE 20 MG PO TABS
20.0000 mg | ORAL_TABLET | Freq: Every day | ORAL | 0 refills | Status: DC
  Filled 2023-11-30: qty 3, 3d supply, fill #0

## 2023-11-30 MED ORDER — ACETAMINOPHEN 500 MG PO TABS
1000.0000 mg | ORAL_TABLET | Freq: Three times a day (TID) | ORAL | 0 refills | Status: AC | PRN
  Filled 2023-11-30: qty 60, 10d supply, fill #0

## 2023-11-30 MED ORDER — IBUPROFEN 800 MG PO TABS
800.0000 mg | ORAL_TABLET | Freq: Three times a day (TID) | ORAL | 0 refills | Status: AC | PRN
  Filled 2023-11-30: qty 60, 20d supply, fill #0

## 2023-11-30 MED ORDER — POTASSIUM CHLORIDE CRYS ER 20 MEQ PO TBCR
20.0000 meq | EXTENDED_RELEASE_TABLET | Freq: Every day | ORAL | 0 refills | Status: DC
  Filled 2023-11-30: qty 3, 3d supply, fill #0

## 2023-11-30 NOTE — Lactation Note (Signed)
 This note was copied from a baby's chart. Lactation Consultation Note  Patient Name: Tracy Nichols Unijb'd Date: 11/30/2023 Age:22 hours LC attempted to follow up with family but MOB was asleep at this time. LC services will follow up with family in the morning.    Maternal Data    Feeding Nipple Type: Extra Slow Flow  LATCH Score                    Lactation Tools Discussed/Used    Interventions    Discharge    Consult Status      [name] 11/30/2023, 1:20 AM

## 2023-11-30 NOTE — Discharge Summary (Signed)
 Postpartum Discharge Summary  Date of Service updated     Patient Name: Tracy Nichols DOB: [birth year] MRN: [id number]  Date of admission: 11/27/2023 Delivery date:11/28/2023 Delivering provider: ELDONNA SUZEN OCTAVE Date of discharge: 11/30/2023  Admitting diagnosis: Encounter for induction of labor [Z34.90] Intrauterine pregnancy: [redacted]w[redacted]d     Secondary diagnosis:  Principal Problem:   SVD (spontaneous vaginal delivery) Active Problems:   Supervision of other normal pregnancy, antepartum   Anemia of mother in pregnancy, antepartum, third trimester   Gestational HTN  Additional problems: None    Discharge diagnosis: Term Pregnancy Delivered and Gestational Hypertension                                              Post partum procedures:None Augmentation: AROM, Cytotec , and IP Foley Complications: None  Hospital course: Induction of Labor With Vaginal Delivery   23 y.o. yo G1P1001 at [redacted]w[redacted]d was admitted to the hospital 11/27/2023 for induction of labor.  Indication for induction: BPP 6/8 with EFW 16%.  Patient had an labor course complicated by development of gHTN. Membrane Rupture Time/Date: 12:19 AM,11/28/2023  Delivery Method:Vaginal, Spontaneous Operative Delivery:N/A Episiotomy: None Lacerations:  2nd degree;Perineal Details of delivery can be found in separate delivery note.  Patient had a postpartum course complicated by none. Bps normotensive. Lasix /K x5 days started. Patient is discharged home 11/30/23.  Newborn Data: Birth date:11/28/2023 Birth time:9:26 AM Gender:Female Living status:Living Apgars:9 ,9  [phone] g  Magnesium Sulfate received: No BMZ received: No Rhophylac:No MMR:No T-DaP:Declined Flu: No RSV Vaccine received: No Transfusion:No  Immunizations received: Immunization History  Administered Date(s) Administered   Tdap 11/29/2023    Physical exam  Vitals:   11/29/23 1213 11/29/23 1929 11/29/23 2103 11/30/23 0609  BP: 116/61 (!) 137/96  132/76 104/64  Pulse: 95 99 84 80  Resp: 17 18  18   Temp: 97.8 F (36.6 C) 98.1 F (36.7 C)  97.8 F (36.6 C)  TempSrc:  Oral  Oral  SpO2:  100%  100%   General: alert, cooperative, and no distress Lochia: appropriate Uterine Fundus: firm Incision: N/A DVT Evaluation: No evidence of DVT seen on physical exam. Labs: Lab Results  Component Value Date   WBC 9.5 11/27/2023   HGB 10.7 (L) 11/27/2023   HCT 33.4 (L) 11/27/2023   MCV 84.3 11/27/2023   PLT 199 11/27/2023      Latest Ref Rng & Units 11/27/2023    1:12 PM  CMP  Glucose 70 - 99 mg/dL 871   BUN 6 - 20 mg/dL 7   Creatinine 9.55 - 8.99 mg/dL 9.35   Sodium 864 - 854 mmol/L 136   Potassium 3.5 - 5.1 mmol/L 3.5   Chloride 98 - 111 mmol/L 108   CO2 22 - 32 mmol/L 16   Calcium 8.9 - 10.3 mg/dL 9.2   Total Protein 6.5 - 8.1 g/dL 6.5   Total Bilirubin 0.0 - 1.2 mg/dL 0.6   Alkaline Phos 38 - 126 U/L 213   AST 15 - 41 U/L 20   ALT 0 - 44 U/L 13    Edinburgh Score:    11/29/2023   10:05 PM  Edinburgh Postnatal Depression Scale Screening Tool  I have been able to laugh and see the funny side of things. 0  I have looked forward with enjoyment to things. 0  I have blamed myself  unnecessarily when things went wrong. 1  I have been anxious or worried for no good reason. 1  I have felt scared or panicky for no good reason. 0  Things have been getting on top of me. 0  I have been so unhappy that I have had difficulty sleeping. 0  I have felt sad or miserable. 0  I have been so unhappy that I have been crying. 1  The thought of harming myself has occurred to me. 0  Edinburgh Postnatal Depression Scale Total 3   Edinburgh Postnatal Depression Scale Total: 3   After visit meds:  Allergies as of 11/30/2023   No Known Allergies      Medication List     STOP taking these medications    aspirin EC 81 MG tablet       TAKE these medications    ACCRUFeR  30 MG Caps Generic drug: Ferric Maltol  Take 1 capsule (30 mg  total) by mouth daily with breakfast.   acetaminophen  500 MG tablet Commonly known as: TYLENOL  Take 2 tablets (1,000 mg total) by mouth every 8 (eight) hours as needed (for pain scale < 4).   furosemide  20 MG tablet Commonly known as: LASIX  Take 1 tablet (20 mg total) by mouth daily.   ibuprofen  800 MG tablet Commonly known as: ADVIL  Take 1 tablet (800 mg total) by mouth every 8 (eight) hours as needed for mild pain (pain score 1-3) or moderate pain (pain score 4-6).   potassium chloride  SA 20 MEQ tablet Commonly known as: KLOR-CON  M Take 1 tablet (20 mEq total) by mouth daily.   prenatal multivitamin Tabs tablet Take 1 tablet by mouth daily at 12 noon.   senna 8.6 MG Tabs tablet Commonly known as: SENOKOT Take 2 tablets (17.2 mg total) by mouth at bedtime as needed.         Discharge home in stable condition Infant Feeding: Breast Infant Disposition:home with mother Discharge instruction: per After Visit Summary and Postpartum booklet. Activity: Advance as tolerated. Pelvic rest for 6 weeks.  Diet: routine diet Future Appointments:No future appointments. Follow up Visit:  Message sent to [facility] 8/11  Please schedule this patient for a In person postpartum visit in 6 weeks with the following provider: Any provider. Additional Postpartum F/U: BP check 1 week  Low risk pregnancy complicated by: gHTN Delivery mode:  Vaginal, Spontaneous Anticipated Birth Control:  IUD OP   11/30/2023 Almarie CHRISTELLA Moats, MD

## 2023-11-30 NOTE — Progress Notes (Signed)
 Discharge instructions and prescriptions given to pt. Discussed post vaginal delivery care, signs and symptoms to report to the MD, upcoming appointments, and meds. Pt verbalizes understanding and has no questions or concerns at this time. Pt discharged home from hospital with baby in stable condition.

## 2023-11-30 NOTE — Lactation Note (Addendum)
 This note was copied from a baby's chart. Lactation Consultation Note  Patient Name: Tracy Nichols Unijb'd Date: 11/30/2023 Age:22 hours Reason for consult: Follow-up assessment;Early term 37-38.6wks  P1, 38 wks, @ 51 hrs of life. Stork pump processing. Mom to discharge today. Mom breastfeeding with LC arrival. Discussed position options, demonstrated football positioning. Couplet responded beautifully, praised latching skills- highlighted visual aspects of good latch. Hand pump- stand alone-  provided, and demonstrated pump kit parts hand pump for mom. Best for engorged/swollen breast. Encouraged mom to keep working on big mouth latch with baby and use EBM or coconut oil after each feed. Discussed cluster feeding overnight/ early morning brings in our milk supply, shared expectations of milk coming in. Highlighted risk of engorgement. Discussed hand pump/express to soften breasts, motrin  as anti-inflammatory, and ice packs for 10-20 minutes post feed/pumping if still over-full is the best treatments for inflamed/engorged breasts.  1245- Stork pump received, discussed flange pump inserts with mom  Maternal Data Does the patient have breastfeeding experience prior to this delivery?: No  Feeding Mother's Current Feeding Choice: Breast Milk and Formula Nipple Type: Extra Slow Flow  LATCH Score Latch: Grasps breast easily, tongue down, lips flanged, rhythmical sucking.  Audible Swallowing: Spontaneous and intermittent  Type of Nipple: Everted at rest and after stimulation  Comfort (Breast/Nipple): Soft / non-tender  Hold (Positioning): Assistance needed to correctly position infant at breast and maintain latch. (Position changes offered, mom easily takes over)  [facility] Score: 9   Lactation Tools Discussed/Used Flange Size: 18 Breast pump type: Manual Pump Education: Milk Storage;Setup, frequency, and cleaning  Interventions Interventions: Breast feeding basics reviewed;Assisted  with latch;Hand express;Breast compression;Expressed milk;Hand pump;Education;LC Services brochure;CDC milk storage guidelines  Discharge Discharge Education: Engorgement and breast care Pump: Referral sent for [facility] Pump;Manual;Personal  Consult Status Consult Status: Complete Date: 11/30/23 Follow-up type: In-patient    Najee Cowens 11/30/2023, 12:34 PM

## 2023-12-02 ENCOUNTER — Other Ambulatory Visit

## 2023-12-02 ENCOUNTER — Ambulatory Visit

## 2023-12-07 ENCOUNTER — Ambulatory Visit (INDEPENDENT_AMBULATORY_CARE_PROVIDER_SITE_OTHER)

## 2023-12-07 DIAGNOSIS — Z0130 Encounter for examination of blood pressure without abnormal findings: Secondary | ICD-10-CM

## 2023-12-07 NOTE — Progress Notes (Signed)
 Subjective:  Tracy Nichols is a G1P1001 here for BP check.  She is 1 week postpartum following a normal spontaneous vaginal delivery.    Hypertension ROS: Patient denies headache and visual changes   Objective:  BP 125/85   Pulse 90   Appearance alert, well appearing, and in no distress.  Assessment:   Blood Pressure well controlled.   Plan:  Current treatment plan is effective, no change in therapy..   Zaylie Gisler N Keondre Markson, RN

## 2023-12-29 ENCOUNTER — Ambulatory Visit: Admitting: Certified Nurse Midwife

## 2023-12-29 DIAGNOSIS — Z30430 Encounter for insertion of intrauterine contraceptive device: Secondary | ICD-10-CM

## 2023-12-29 DIAGNOSIS — Z3009 Encounter for other general counseling and advice on contraception: Secondary | ICD-10-CM

## 2024-01-14 ENCOUNTER — Ambulatory Visit: Admitting: Obstetrics and Gynecology

## 2024-01-18 ENCOUNTER — Encounter: Payer: Self-pay | Admitting: Emergency Medicine

## 2024-01-18 ENCOUNTER — Ambulatory Visit
Admission: EM | Admit: 2024-01-18 | Discharge: 2024-01-18 | Disposition: A | Discharge status: Home / Self Care | Attending: Nurse Practitioner | Admitting: Nurse Practitioner

## 2024-01-18 DIAGNOSIS — R300 Dysuria: Secondary | ICD-10-CM | POA: Diagnosis present

## 2024-01-18 DIAGNOSIS — N3001 Acute cystitis with hematuria: Secondary | ICD-10-CM | POA: Insufficient documentation

## 2024-01-18 DIAGNOSIS — N898 Other specified noninflammatory disorders of vagina: Secondary | ICD-10-CM | POA: Insufficient documentation

## 2024-01-18 LAB — POCT URINE DIPSTICK
Bilirubin, UA: NEGATIVE
Glucose, UA: NEGATIVE mg/dL
Ketones, POC UA: NEGATIVE mg/dL
Nitrite, UA: NEGATIVE
POC PROTEIN,UA: 30 — AB
Spec Grav, UA: 1.025 (ref 1.010–1.025)
Urobilinogen, UA: 0.2 U/dL
pH, UA: 6.5 (ref 5.0–8.0)

## 2024-01-18 LAB — POCT URINE PREGNANCY: Preg Test, Ur: NEGATIVE

## 2024-01-18 MED ORDER — PHENAZOPYRIDINE HCL 200 MG PO TABS: 200.0000 mg | ORAL_TABLET | ORAL | 0 refills | Status: AC

## 2024-01-18 MED ORDER — NITROFURANTOIN MONOHYD MACRO 100 MG PO CAPS: 100.0000 mg | ORAL_CAPSULE | Freq: Two times a day (BID) | ORAL | 0 refills | Status: DC

## 2024-01-18 NOTE — ED Triage Notes (Addendum)
 Pt presents c/o frequent urination x 2 days. Pt reports she is having to go often but going a lot. Pt also reports a little burning sensation after urinating.

## 2024-01-18 NOTE — ED Provider Notes (Signed)
 [facility]    CSN: [id number] Arrival date & time: 01/18/24  0804      History   Chief Complaint Chief Complaint  Patient presents with   Possible UTI    HPI Tracy Nichols is a 23 y.o. female.   Discussed the use of AI scribe software for clinical note transcription with the patient, who gave verbal consent to proceed.   Postpartum female presents with dysuria that began yesterday in the middle of the day. She reports pain with urination and describes urinating frequently in small amounts. She delivered a baby on August 9th and has had vaginal discharge since delivery, approximately one month ago. She got her first period last Thursday, which ended a week prior to the visit. She denies itching, irritation, or odor associated with the discharge. She denies nausea, vomiting, abdominal pain, back pain, or fever. She is not breastfeeding and is not on birth control.  The following sections of the patient's history were reviewed and updated as appropriate: allergies, current medications, past family history, past medical history, past social history, past surgical history, and problem list.       Past Medical History:  Diagnosis Date   Anemia    Medical history non-contributory     Patient Active Problem List   Diagnosis Date Noted   Gestational HTN 11/29/2023   SVD (spontaneous vaginal delivery) 11/27/2023   Anemia of mother in pregnancy, antepartum, third trimester 09/22/2023   Supervision of other normal pregnancy, antepartum 06/24/2023    Past Surgical History:  Procedure Laterality Date   NO PAST SURGERIES      OB History     Gravida  1   Para  1   Term  1   Preterm  0   AB  0   Living  1      SAB  0   IAB  0   Ectopic  0   Multiple  0   Live Births  1            Home Medications    Prior to Admission medications   Medication Sig Start Date End Date Taking? Authorizing Provider  nitrofurantoin, macrocrystal-monohydrate,  (MACROBID) 100 MG capsule Take 1 capsule (100 mg total) by mouth 2 (two) times daily. 01/18/24  Yes Zigmund Linse, FNP  phenazopyridine (PYRIDIUM) 200 MG tablet Take 1 tablet (200 mg total) by mouth 3 (three) times daily at 8am, 3pm and bedtime for 2 days. 01/18/24 01/20/24 Yes Mianna Iezzi, FNP  acetaminophen  (TYLENOL ) 500 MG tablet Take 2 tablets (1,000 mg total) by mouth every 8 (eight) hours as needed (for pain scale < 4). 11/30/23   Nicholaus Almarie HERO, MD  Ferric Maltol  (ACCRUFER ) 30 MG CAPS Take 1 capsule (30 mg total) by mouth daily with breakfast. 09/28/23   Constant, Peggy, MD  furosemide  (LASIX ) 20 MG tablet Take 1 tablet (20 mg total) by mouth daily. 11/30/23   Nicholaus Almarie HERO, MD  ibuprofen  (ADVIL ) 800 MG tablet Take 1 tablet (800 mg total) by mouth every 8 (eight) hours as needed for mild pain (pain score 1-3) or moderate pain (pain score 4-6). 11/30/23   Nicholaus Almarie HERO, MD  potassium chloride  SA (KLOR-CON  M) 20 MEQ tablet Take 1 tablet (20 mEq total) by mouth daily. 11/30/23   Nicholaus Almarie HERO, MD  Prenatal Vit-Fe Fumarate-FA (PRENATAL MULTIVITAMIN) TABS tablet Take 1 tablet by mouth daily at 12 noon.    [provider]  senna (SENOKOT) 8.6 MG  TABS tablet Take 2 tablets (17.2 mg total) by mouth at bedtime as needed. 11/30/23   Nicholaus Almarie HERO, MD    Family History History reviewed. No pertinent family history.  Social History Social History   Tobacco Use   Smoking status: Never    Passive exposure: Never   Smokeless tobacco: Never  Vaping Use   Vaping status: Never Used  Substance Use Topics   Alcohol use: Not Currently   Drug use: Not Currently    Types: Marijuana     Allergies   Patient has no known allergies.   Review of Systems Review of Systems  Constitutional:  Negative for fever.  Gastrointestinal:  Negative for abdominal pain, nausea and vomiting.  Genitourinary:  Positive for decreased urine volume, dysuria, frequency and vaginal  discharge (since having baby on Nov 28, 2023). Negative for menstrual problem (LMP was a little over a week ago; first cycle since having baby on Nov 28, 2023).       No vaginal itching, irritation or odor   Musculoskeletal:  Negative for back pain.  All other systems reviewed and are negative.    Physical Exam Triage Vital Signs ED Triage Vitals  Encounter Vitals Group     BP 01/18/24 0839 128/83     Girls Systolic BP Percentile --      Girls Diastolic BP Percentile --      Boys Systolic BP Percentile --      Boys Diastolic BP Percentile --      Pulse Rate 01/18/24 0839 73     Resp 01/18/24 0839 18     Temp 01/18/24 0839 98.9 F (37.2 C)     Temp Source 01/18/24 0839 Oral     SpO2 01/18/24 0839 98 %     Weight 01/18/24 0837 139 lb 9.6 oz (63.3 kg)     Height --      Head Circumference --      Peak Flow --      Pain Score 01/18/24 0836 7     Pain Loc --      Pain Education --      Exclude from Growth Chart --    No data found.  Updated Vital Signs BP 128/83 (BP Location: Left Arm)   Pulse 73   Temp 98.9 F (37.2 C) (Oral)   Resp 18   Wt 139 lb 9.6 oz (63.3 kg)   LMP 01/18/2024 (Exact Date)   SpO2 98%   Breastfeeding No   BMI 26.38 kg/m   Visual Acuity Right Eye Distance:   Left Eye Distance:   Bilateral Distance:    Right Eye Near:   Left Eye Near:    Bilateral Near:     Physical Exam Constitutional:      General: She is not in acute distress.    Appearance: Normal appearance. She is not ill-appearing, toxic-appearing or diaphoretic.  HENT:     Head: Normocephalic.     Nose: Nose normal.     Mouth/Throat:     Mouth: Mucous membranes are moist.  Eyes:     Conjunctiva/sclera: Conjunctivae normal.  Cardiovascular:     Rate and Rhythm: Normal rate.  Pulmonary:     Effort: Pulmonary effort is normal.  Abdominal:     Palpations: Abdomen is soft.  Genitourinary:    Comments: Deferred; patient performed self-swab for Aptima testing  Musculoskeletal:         General: Normal range of motion.     Cervical back:  Normal range of motion and neck supple.  Skin:    General: Skin is warm and dry.  Neurological:     General: No focal deficit present.     Mental Status: She is alert and oriented to person, place, and time.  Psychiatric:        Mood and Affect: Mood normal.        Behavior: Behavior normal.      UC Treatments / Results  Labs (all labs ordered are listed, but only abnormal results are displayed) Labs Reviewed  POCT URINE DIPSTICK - Abnormal; Notable for the following components:      Result Value   Clarity, UA cloudy (*)    Blood, UA moderate (*)    POC PROTEIN,UA =30 (*)    Leukocytes, UA Small (1+) (*)    All other components within normal limits  POCT URINE PREGNANCY - Normal  URINE CULTURE  CERVICOVAGINAL ANCILLARY ONLY    EKG   Radiology No results found.  Procedures Procedures (including critical care time)  Medications Ordered in UC Medications - No data to display  Initial Impression / Assessment and Plan / UC Course  I have reviewed the triage vital signs and the nursing notes.  Pertinent labs & imaging results that were available during my care of the patient were reviewed by me and considered in my medical decision making (see chart for details).     Patient presents with symptoms consistent with a urinary tract infection. Urinalysis reveals microscopic hematuria, and leukocyte esterase, supporting the diagnosis. Patient also has been having some vaginal discharge since having her baby last month. Testing was obtained for bacterial vaginosis, yeast, gonorrhea, chlamydia, and trichomonas. Patient education was provided on avoiding douching, vaginal sprays, or deodorants, and wearing cotton-lined underwear to reduce moisture and improve airflow. Nitrofurantoin was prescribed to be taken twice daily for 5 days. Pyridium was prescribed for urinary discomfort, to be taken three times daily for 2 days, with  counseling that it may turn the urine orange. A urine culture was sent to identify the causative organism and assess antibiotic sensitivity. Patient was advised that they will be contacted only if the culture results require a change in treatment; otherwise, results can be reviewed via MyChart. Patient instructed to increase fluid intake and monitor symptoms. Follow up with primary care provider if symptoms do not improve or worsen. Emergency evaluation is warranted for fever, back or flank pain, nausea, vomiting, or signs of systemic illness.  Today's evaluation has revealed no signs of a dangerous process. Discussed diagnosis with patient and/or guardian. Patient and/or guardian aware of their diagnosis, possible red flag symptoms to watch out for and need for close follow up. Patient and/or guardian understands verbal and written discharge instructions. Patient and/or guardian comfortable with plan and disposition.  Patient and/or guardian has a clear mental status at this time, good insight into illness (after discussion and teaching) and has clear judgment to make decisions regarding their care  Documentation was completed with the aid of voice recognition software. Transcription may contain typographical errors.   Final Clinical Impressions(s) / UC Diagnoses   Final diagnoses:  Dysuria  Acute cystitis with hematuria  Vaginal discharge     Discharge Instructions      You were seen today for a urinary tract infection. Your urine test showed signs of infection, and you also reported vaginal discharge since your recent delivery. Extra testing was sent to check for potential infectious causes of the discharge. You were  prescribed Nitrofurantoin to take twice daily for 5 days. You were also prescribed Pyridium to take three times daily for 2 days to help with urinary discomfort. This medicine may turn your urine orange, which is normal. A urine culture was sent; you will only be contacted if the  results show that your antibiotic needs to be changed. Otherwise, you can review the results in MyChart. Please drink plenty of fluids and avoid douching, vaginal sprays, or deodorants. Wearing cotton-lined underwear can help reduce moisture and improve airflow. Go to the ER right away if you develop fever, back or side pain, nausea, vomiting, or if you start to feel very ill. Follow up with your primary care provider if your symptoms do not improve or if they worsen.     ED Prescriptions     Medication Sig Dispense Auth. Provider   nitrofurantoin, macrocrystal-monohydrate, (MACROBID) 100 MG capsule Take 1 capsule (100 mg total) by mouth 2 (two) times daily. 10 capsule Iola Lukes, FNP   phenazopyridine (PYRIDIUM) 200 MG tablet Take 1 tablet (200 mg total) by mouth 3 (three) times daily at 8am, 3pm and bedtime for 2 days. 6 tablet Iola Lukes, FNP      PDMP not reviewed this encounter.   Iola Lukes, [place] 01/18/24 1007

## 2024-01-18 NOTE — Discharge Instructions (Addendum)
 You were seen today for a urinary tract infection. Your urine test showed signs of infection, and you also reported vaginal discharge since your recent delivery. Extra testing was sent to check for potential infectious causes of the discharge. You were prescribed Nitrofurantoin to take twice daily for 5 days. You were also prescribed Pyridium to take three times daily for 2 days to help with urinary discomfort. This medicine may turn your urine orange, which is normal. A urine culture was sent; you will only be contacted if the results show that your antibiotic needs to be changed. Otherwise, you can review the results in MyChart. Please drink plenty of fluids and avoid douching, vaginal sprays, or deodorants. Wearing cotton-lined underwear can help reduce moisture and improve airflow. Go to the ER right away if you develop fever, back or side pain, nausea, vomiting, or if you start to feel very ill. Follow up with your primary care provider if your symptoms do not improve or if they worsen.

## 2024-01-19 LAB — CERVICOVAGINAL ANCILLARY ONLY
Bacterial Vaginitis (gardnerella): NEGATIVE
Candida Glabrata: NEGATIVE
Candida Vaginitis: NEGATIVE
Chlamydia: NEGATIVE
Comment: NEGATIVE
Comment: NEGATIVE
Comment: NEGATIVE
Comment: NEGATIVE
Comment: NEGATIVE
Comment: NORMAL
Neisseria Gonorrhea: NEGATIVE
Trichomonas: NEGATIVE

## 2024-01-20 ENCOUNTER — Ambulatory Visit: Payer: Self-pay | Admitting: Nurse Practitioner

## 2024-01-20 LAB — URINE CULTURE: Culture: >=100000 — AB

## 2024-01-20 MED ORDER — CIPROFLOXACIN HCL 500 MG PO TABS: 500.0000 mg | ORAL_TABLET | Freq: Two times a day (BID) | ORAL | 0 refills | Status: DC

## 2024-01-20 NOTE — Progress Notes (Signed)
 My Chart Message sent to patient notifying her of results and information.

## 2024-01-20 NOTE — Progress Notes (Signed)
 Urine culture returned positive for bacterial growth. Patient should discontinue nitrofurantoin and initiate ciprofloxacin, as ciprofloxacin provides more effective coverage for the identified bacterial strain.

## 2024-03-30 ENCOUNTER — Encounter: Payer: Self-pay | Admitting: Obstetrics and Gynecology

## 2024-03-30 ENCOUNTER — Ambulatory Visit (INDEPENDENT_AMBULATORY_CARE_PROVIDER_SITE_OTHER): Admitting: Obstetrics and Gynecology

## 2024-03-30 VITALS — BP 138/81 | HR 64 | Wt 133.0 lb

## 2024-03-30 DIAGNOSIS — Z3009 Encounter for other general counseling and advice on contraception: Secondary | ICD-10-CM | POA: Diagnosis not present

## 2024-03-30 DIAGNOSIS — Z3202 Encounter for pregnancy test, result negative: Secondary | ICD-10-CM | POA: Diagnosis not present

## 2024-03-30 DIAGNOSIS — Z30016 Encounter for initial prescription of transdermal patch hormonal contraceptive device: Secondary | ICD-10-CM

## 2024-03-30 LAB — POCT URINE PREGNANCY: Preg Test, Ur: NEGATIVE

## 2024-03-30 MED ORDER — NORELGESTROMIN-ETH ESTRADIOL 150-35 MCG/24HR TD PTWK: 1.0000 | MEDICATED_PATCH | TRANSDERMAL | 3 refills | Status: AC

## 2024-03-30 NOTE — Progress Notes (Signed)
 Pt is in office to discuss BC.  Pt request UPT today and is due for pap. Pt is sexually active with no BC use. Pt states last intercourse was last week.  Pt is interested in possible IUD.

## 2024-03-30 NOTE — Progress Notes (Signed)
° °  RETURN GYNECOLOGY VISIT  Subjective:  Tracy Nichols is a 23 y.o. G1P1001 with LMP 03/01/24 presenting for contraception counseling/IUD insertion  Would like IUD placed. She has had unprotected intercourse weekly with most recent last week (>5 days ago).  Has been on patch in the past.  Is due for pap  Objective:   Vitals:   03/30/24 1605  BP: 138/81  Pulse: 64  Weight: 133 lb (60.3 kg)   General:  Alert, oriented and cooperative. Patient is in no acute distress.  Skin: Skin is warm and dry. No rash noted.   Cardiovascular: Normal heart rate noted  Respiratory: Normal respiratory effort, no problems with respiration noted   Assessment and Plan:  Tracy Nichols is a 23 y.o. here for discussion of contraception.  Encounter for general counseling and advice on contraceptive management Discussed that early pregnancy is possible and that IUD placement > 5 days from last IC is not sufficient for emergency contraception. Reviewed risks of pregnancy with IUD in place.  Offered IUD placement today regardless or offered pt to reschedule After discussion, she would like to reschedule Will use patch as back up until her next appt. Reviewed need for condoms/abstinence for next 7 days after starting patch and that hormonal contraception would not cause harm to early pregnancy  Encounter for initial prescription of transdermal patch hormonal contraceptive device -     POCT urine pregnancy -- negative -     norelgestromin-ethinyl estradiol (XULANE) 150-35 MCG/24HR transdermal patch; Place 1 patch onto the skin once a week.  Return in about 2 weeks (around 04/13/2024) for IUD insertion & pap.  Future Appointments  Date Time Provider Department Center  05/02/2024  1:30 PM Delores Nidia CROME, FNP [facility] None   Kieth JAYSON Carolin, MD

## 2024-05-02 ENCOUNTER — Ambulatory Visit: Admitting: Obstetrics and Gynecology

## 2024-05-11 ENCOUNTER — Ambulatory Visit: Payer: Self-pay

## 2024-05-12 ENCOUNTER — Ambulatory Visit (HOSPITAL_COMMUNITY): Payer: Self-pay

## 2024-05-14 ENCOUNTER — Inpatient Hospital Stay: Admission: RE | Admit: 2024-05-14 | Discharge: 2024-05-14

## 2024-05-14 VITALS — BP 114/83 | HR 144 | Temp 101.3°F | Resp 22

## 2024-05-14 DIAGNOSIS — J101 Influenza due to other identified influenza virus with other respiratory manifestations: Secondary | ICD-10-CM | POA: Diagnosis not present

## 2024-05-14 LAB — POCT INFLUENZA A/B
Influenza A, POC: POSITIVE — AB
Influenza B, POC: NEGATIVE

## 2024-05-14 MED ORDER — ACETAMINOPHEN 325 MG PO TABS
650.0000 mg | ORAL_TABLET | Freq: Once | ORAL | Status: AC
  Administered 2024-05-14: 650 mg via ORAL

## 2024-05-14 MED ORDER — OSELTAMIVIR PHOSPHATE 75 MG PO CAPS: 75.0000 mg | ORAL_CAPSULE | Freq: Two times a day (BID) | ORAL | 0 refills | Status: AC

## 2024-05-14 NOTE — ED Triage Notes (Addendum)
 Since yesterday pt reports cough, hot/cold, back pain, headache, decreased appetite, fatigue and scratchy throat. She took dayquil yesterday with some relief. Unknown fever. She is 5 months post partum.

## 2024-05-14 NOTE — Discharge Instructions (Addendum)
 You have tested positive for flu A today.  Tamiflu  has been sent to pharmacy to start today.   Continue use of OTC cold medicine with Tamiflu .

## 2024-05-14 NOTE — ED Provider Notes (Signed)
 " [facility]    CSN: [id number] Arrival date & time: 05/14/24  0945      History   Chief Complaint Chief Complaint  Patient presents with   Cough    HPI Tracy Nichols is a 24 y.o. female.   Pt presents today due to cough, body aches, wheezing, loss of appetite, chills, headache, and scratchy throat that started yesterday. Pt found to have fever during triage. Pt states that she has attempted use OTC cold meds with temporary relief. 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  The history is provided by the patient.  Cough   Past Medical History:  Diagnosis Date   Anemia    Gestational HTN 11/29/2023    There are no active problems to display for this patient.   Past Surgical History:  Procedure Laterality Date   NO PAST SURGERIES      OB History     Gravida  1   Para  1   Term  1   Preterm  0   AB  0   Living  1      SAB  0   IAB  0   Ectopic  0   Multiple  0   Live Births  1            Home Medications    Prior to Admission medications  Medication Sig Start Date End Date Taking? Authorizing Provider  acetaminophen  (TYLENOL ) 500 MG tablet Take 2 tablets (1,000 mg total) by mouth every 8 (eight) hours as needed (for pain scale < 4). 11/30/23  Yes Nicholaus Almarie HERO, MD  norelgestromin -ethinyl estradiol  (XULANE) 150-35 MCG/24HR transdermal patch Place 1 patch onto the skin once a week. 03/30/24  Yes Erik Kieth BROCKS, MD  oseltamivir  (TAMIFLU ) 75 MG capsule Take 1 capsule (75 mg total) by mouth every 12 (twelve) hours. 05/14/24  Yes Andra Corean BROCKS, PA-C  Ferric Maltol  (ACCRUFER ) 30 MG CAPS Take 1 capsule (30 mg total) by mouth daily with breakfast. Patient not taking: Reported on 05/14/2024 09/28/23   Constant, Peggy, MD  fluticasone (CUTIVATE) 0.05 % cream Apply 1 Application topically daily. Patient not taking: Reported on 05/14/2024 03/16/24   [provider]  ibuprofen  (ADVIL ) 800 MG tablet Take 1 tablet (800  mg total) by mouth every 8 (eight) hours as needed for mild pain (pain score 1-3) or moderate pain (pain score 4-6). Patient not taking: Reported on 05/14/2024 11/30/23   Nicholaus Almarie HERO, MD  Prenatal Vit-Fe Fumarate-FA (PRENATAL MULTIVITAMIN) TABS tablet Take 1 tablet by mouth daily at 12 noon. Patient not taking: Reported on 05/14/2024    [provider]    Family History History reviewed. No pertinent family history.  Social History Social History[1]   Allergies   Patient has no known allergies.   Review of Systems Review of Systems  Respiratory:  Positive for cough.      Physical Exam Triage Vital Signs ED Triage Vitals  Encounter Vitals Group     BP 05/14/24 1009 114/83     Girls Systolic BP Percentile --      Girls Diastolic BP Percentile --      Boys Systolic BP Percentile --      Boys Diastolic BP Percentile --      Pulse Rate 05/14/24 1009 (!) 144     Resp 05/14/24 1009 (!) 22     Temp 05/14/24 1009 (!) 101.3 F (38.5 C)     Temp  Source 05/14/24 1009 Oral     SpO2 05/14/24 1009 95 %     Weight --      Height --      Head Circumference --      Peak Flow --      Pain Score 05/14/24 1006 8     Pain Loc --      Pain Education --      Exclude from Growth Chart --    No data found.  Updated Vital Signs BP 114/83 (BP Location: Left Arm)   Pulse (!) 144   Temp (!) 101.3 F (38.5 C) (Oral)   Resp (!) 22   LMP 05/01/2024   SpO2 95%   Breastfeeding No   Visual Acuity Right Eye Distance:   Left Eye Distance:   Bilateral Distance:    Right Eye Near:   Left Eye Near:    Bilateral Near:     Physical Exam Vitals and nursing note reviewed.  Constitutional:      General: She is not in acute distress.    Appearance: Normal appearance. She is diaphoretic. She is not ill-appearing or toxic-appearing.  HENT:     Nose: Congestion (moderately enlarged turbinates) present. No rhinorrhea.     Mouth/Throat:     Mouth: Mucous membranes are moist.      Pharynx: Oropharynx is clear. No oropharyngeal exudate or posterior oropharyngeal erythema.  Eyes:     General: No scleral icterus. Cardiovascular:     Rate and Rhythm: Normal rate and regular rhythm.     Heart sounds: Normal heart sounds.  Pulmonary:     Effort: Pulmonary effort is normal. No respiratory distress.     Breath sounds: Normal breath sounds. No wheezing or rhonchi.  Skin:    General: Skin is warm.  Neurological:     Mental Status: She is alert and oriented to person, place, and time.  Psychiatric:        Mood and Affect: Mood normal.        Behavior: Behavior normal.      UC Treatments / Results  Labs (all labs ordered are listed, but only abnormal results are displayed) Labs Reviewed  POCT INFLUENZA A/B - Abnormal; Notable for the following components:      Result Value   Influenza A, POC Positive (*)    All other components within normal limits    EKG   Radiology No results found.  Procedures Procedures (including critical care time)  Medications Ordered in UC Medications  acetaminophen  (TYLENOL ) tablet 650 mg (650 mg Oral Given 05/14/24 1016)    Initial Impression / Assessment and Plan / UC Course  I have reviewed the triage vital signs and the nursing notes.  Pertinent labs & imaging results that were available during my care of the patient were reviewed by me and considered in my medical decision making (see chart for details).      Final Clinical Impressions(s) / UC Diagnoses   Final diagnoses:  Influenza A     Discharge Instructions      You have tested positive for flu A today.  Tamiflu  has been sent to pharmacy to start today.   Continue use of OTC cold medicine with Tamiflu .    ED Prescriptions     Medication Sig Dispense Auth. Provider   oseltamivir  (TAMIFLU ) 75 MG capsule Take 1 capsule (75 mg total) by mouth every 12 (twelve) hours. 10 capsule Andra Corean BROCKS, PA-C      PDMP not  reviewed this encounter.     [1]  Social History Tobacco Use   Smoking status: Never    Passive exposure: Never   Smokeless tobacco: Never  Vaping Use   Vaping status: Never Used  Substance Use Topics   Alcohol use: Yes    Comment: occasional   Drug use: Not Currently    Types: Marijuana     Andra Corean BROCKS, PA-C 05/14/24 1046  "
